# Patient Record
Sex: Female | Born: 1945 | Race: White | Hispanic: No | Smoking: Current every day smoker
Health system: Southern US, Community
[De-identification: ages and names within clinical notes are randomized; demographics above are authoritative.]

## PROBLEM LIST (undated history)

## (undated) DIAGNOSIS — F319 Bipolar disorder, unspecified: Secondary | ICD-10-CM

## (undated) DIAGNOSIS — E785 Hyperlipidemia, unspecified: Secondary | ICD-10-CM

## (undated) DIAGNOSIS — M1990 Unspecified osteoarthritis, unspecified site: Secondary | ICD-10-CM

## (undated) DIAGNOSIS — H409 Unspecified glaucoma: Secondary | ICD-10-CM

## (undated) DIAGNOSIS — I10 Essential (primary) hypertension: Secondary | ICD-10-CM

## (undated) DIAGNOSIS — N289 Disorder of kidney and ureter, unspecified: Secondary | ICD-10-CM

## (undated) DIAGNOSIS — Z720 Tobacco use: Secondary | ICD-10-CM

## (undated) DIAGNOSIS — M797 Fibromyalgia: Secondary | ICD-10-CM

## (undated) DIAGNOSIS — R918 Other nonspecific abnormal finding of lung field: Secondary | ICD-10-CM

## (undated) DIAGNOSIS — E119 Type 2 diabetes mellitus without complications: Secondary | ICD-10-CM

## (undated) DIAGNOSIS — F1010 Alcohol abuse, uncomplicated: Secondary | ICD-10-CM

## (undated) DIAGNOSIS — C801 Malignant (primary) neoplasm, unspecified: Secondary | ICD-10-CM

## (undated) DIAGNOSIS — J45909 Unspecified asthma, uncomplicated: Secondary | ICD-10-CM

## (undated) HISTORY — DX: Alcohol abuse, uncomplicated: F10.10

## (undated) HISTORY — DX: Fibromyalgia: M79.7

## (undated) HISTORY — PX: ABDOMINAL HYSTERECTOMY: SHX81

## (undated) HISTORY — DX: Hyperlipidemia, unspecified: E78.5

## (undated) HISTORY — PX: OTHER SURGICAL HISTORY: SHX169

## (undated) HISTORY — DX: Tobacco use: Z72.0

## (undated) HISTORY — PX: PAROTIDECTOMY: SUR1003

## (undated) HISTORY — DX: Unspecified glaucoma: H40.9

## (undated) HISTORY — DX: Unspecified asthma, uncomplicated: J45.909

## (undated) HISTORY — DX: Other nonspecific abnormal finding of lung field: R91.8

## (undated) HISTORY — PX: GALLBLADDER SURGERY: SHX652

## (undated) HISTORY — DX: Bipolar disorder, unspecified: F31.9

## (undated) HISTORY — DX: Unspecified osteoarthritis, unspecified site: M19.90

---

## 1998-08-23 ENCOUNTER — Other Ambulatory Visit: Admission: RE | Admit: 1998-08-23 | Discharge: 1998-08-23 | Payer: Self-pay | Admitting: Gastroenterology

## 2006-07-11 ENCOUNTER — Inpatient Hospital Stay (HOSPITAL_COMMUNITY): Admission: RE | Admit: 2006-07-11 | Discharge: 2006-07-16 | Payer: Self-pay | Admitting: Psychiatry

## 2006-07-12 ENCOUNTER — Ambulatory Visit: Payer: Self-pay | Admitting: *Deleted

## 2007-05-21 ENCOUNTER — Ambulatory Visit (HOSPITAL_COMMUNITY): Admission: RE | Admit: 2007-05-21 | Discharge: 2007-05-21 | Payer: Self-pay | Admitting: Internal Medicine

## 2007-10-20 ENCOUNTER — Ambulatory Visit (HOSPITAL_COMMUNITY): Admission: RE | Admit: 2007-10-20 | Discharge: 2007-10-20 | Payer: Self-pay | Admitting: Nephrology

## 2007-10-20 ENCOUNTER — Encounter (INDEPENDENT_AMBULATORY_CARE_PROVIDER_SITE_OTHER): Payer: Self-pay | Admitting: Nephrology

## 2007-11-13 ENCOUNTER — Ambulatory Visit: Payer: Self-pay | Admitting: Oncology

## 2007-12-25 LAB — CBC WITH DIFFERENTIAL/PLATELET
BASO%: 1.1 % (ref 0.0–2.0)
Eosinophils Absolute: 0.1 10*3/uL (ref 0.0–0.5)
HGB: 15.4 g/dL (ref 11.6–15.9)
MCHC: 34.2 g/dL (ref 32.0–36.0)
MCV: 89.6 fL (ref 81.0–101.0)
MONO%: 4.4 % (ref 0.0–13.0)
NEUT#: 10.7 10*3/uL — ABNORMAL HIGH (ref 1.5–6.5)
NEUT%: 76.8 % (ref 39.6–76.8)
Platelets: 345 10*3/uL (ref 145–400)
RBC: 5.03 10*6/uL (ref 3.70–5.32)
WBC: 14 10*3/uL — ABNORMAL HIGH (ref 3.9–10.0)
lymph#: 2.3 10*3/uL (ref 0.9–3.3)

## 2007-12-25 LAB — CHCC SMEAR

## 2009-01-04 IMAGING — PT NM PET TUM IMG SKULL BASE T - THIGH
6 series · 25 of 25 positions shown · non-contrast
Comparison: None

CLINICAL DATA: Lymphoma

FDG PET-CT TUMOR IMAGING (SKULL BASE TO THIGHS):
Fasting Blood Glucose:  107
TECHNIQUE: 14.2 mCi F-18 FDG was injected intravenously via the left
antecubital fossa .  Full-ring PET imaging was performed from the skull base
through the mid-thighs 60 minutes after injection.  CT data was obtained and
used for attenuation correction and anatomic localization only.  (This was not
acquired as a diagnostic CT examination.)

[Series 1: pet ac · axial · 3.3mm · 4.69mm/px · z∈[-870,+0]mm · 5 of 267 slices shown]
[im 1/267]
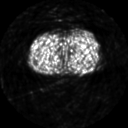
[im 67/267]
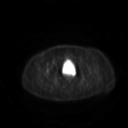
[im 134/267]
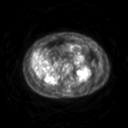
[im 200/267]
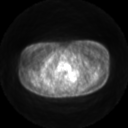
[im 267/267]
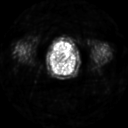

[Series 2: ct images · axial · 3.8mm · 0.98mm/px · z∈[-870,+0]mm · 5 of 266 slices shown]
[im 1/266  brain]
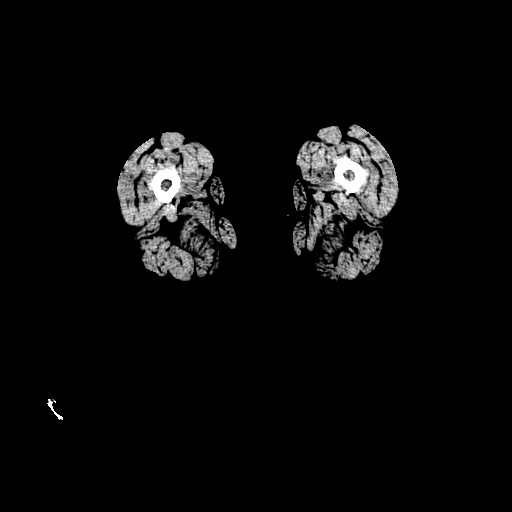
[im 67/266]
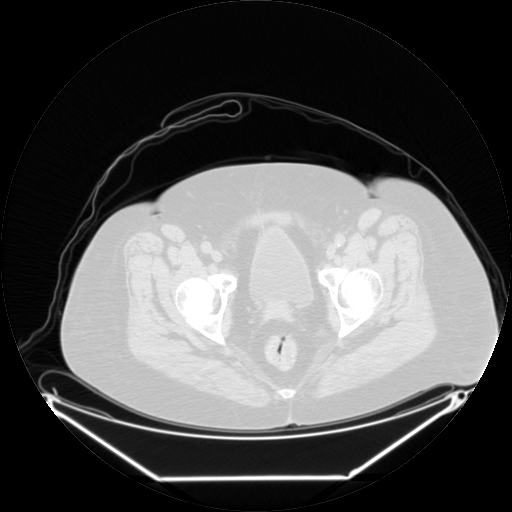
[im 133/266]
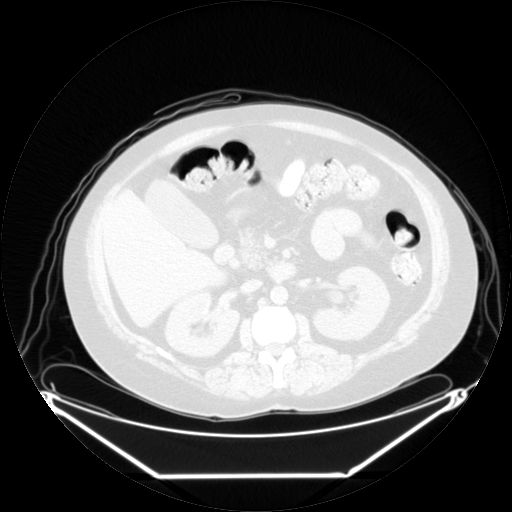
[im 199/266]
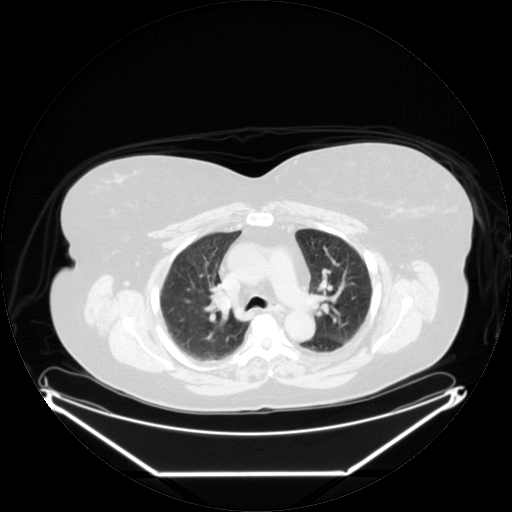
[im 266/266  brain]
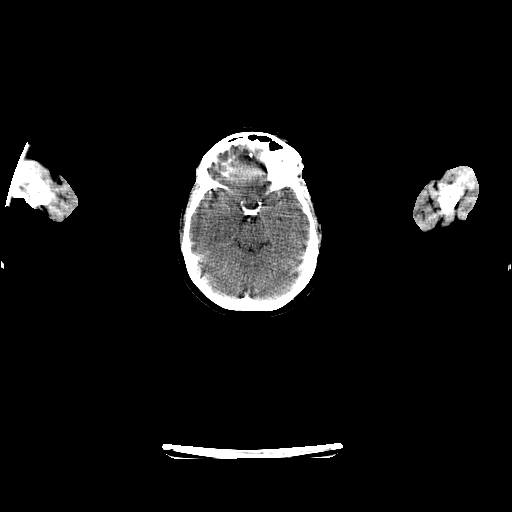

[Series 2: pet nac · axial · 3.3mm · 4.69mm/px · z∈[-870,+0]mm · 6 of 267 slices shown]
[im 1/267]
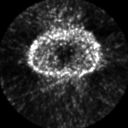
[im 54/267]
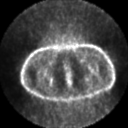
[im 107/267]
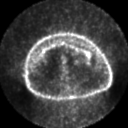
[im 160/267]
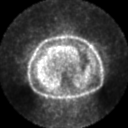
[im 213/267]
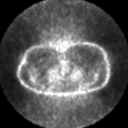
[im 267/267]
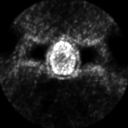

[Series 123: mip · coronal · 3.3mm · 4.69mm/px · 1 of 30 slices shown]
[im 1/30]
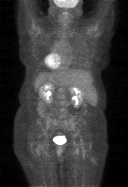

[Series 151: reformatted · axial · 3.3mm · 3.91mm/px · z∈[-870,+0]mm · 6 of 265 slices shown (1 of 2)]
[im 1/265]
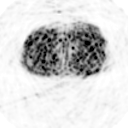
[im 53/265]
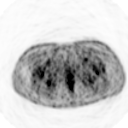
[im 106/265]
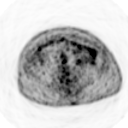
[im 159/265]
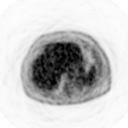
[im 212/265]
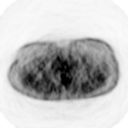
[im 265/265]
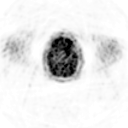

[Series 153: reformatted · coronal · 4.7mm · 6.98mm/px · 2 of 72 slices shown (2 of 2)]
[im 1/72]
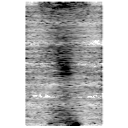
[im 72/72]
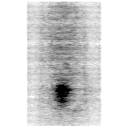

[25 of 25 positions shown; findings below may reference images not displayed]

FINDINGS: No hypermetabolic lymph nodes are seen within the soft tissues of the neck.

No hypermetabolic axillary lymph nodes.

No hypermetabolic mediastinal, or hilar lymph nodes.

No pericardial, or pleural effusions.

Soft tissue attenuating mass along the right paraspinous region this is within
the medial aspect of the right lower hemithorax. This soft tissue nodule/mass
measures 2. x 1.6 cm.  There is low level FDG uptake within this mass within SUV
max equal to 3.2. 

On the contralateral side there is a smaller paraspinal mass/nodule, image 103. 
This  measures 1.0 cm. No significant FDG uptake is identified.  No
hypermetabolic pulmonary nodule or masses noted.

There is normal uptake within the liver parenchyma.

The spleen is normal.

No hypermetabolic retroperitoneal, or small bowel mesentery lymph nodes.

Review the visualized axial and appendicular skeleton shows no abnormal foci of
increased uptake.

Mild fatty infiltration of the liver parenchyma.

There is a punctate stone within the lower pole collecting system of the right
kidney. No evidence for obstructive uropathy.

Negative for pelvic, or inguinal hypermetabolic adenopathy.
IMPRESSION: 1. The two paraspinal masses described on the CT from [facility] are
again seen on the current examination.  The larger mass on the right two
demonstrates low level, nonspecific FDG uptake proportional blood pool activity.
Findings likely represent benign etiology. Low-level malignancy is considered
less favored, but not entirely excluded. This may be managed of 2 ways: A
percutaneous biopsy may be performed to provide a definitive answer ,
alternatively a  more conservative approach would be followup examination in 3
to 6 months.  Findings were discussed with Dr. Montalbo.

## 2011-05-03 NOTE — Discharge Summary (Signed)
NAME:  Cathy Meadows, Cathy Meadows NO.:  [id number]   MEDICAL RECORD NO.:  [id number]          PATIENT TYPE:  IPS   LOCATION:  0305                          FACILITY:  [facility]   PHYSICIAN:  Jasmine Pang, M.D. DATE OF BIRTH:  15-Feb-1946   DATE OF ADMISSION:  07/11/2006  DATE OF DISCHARGE:  07/16/2006                                 DISCHARGE SUMMARY   IDENTIFYING INFORMATION:  The patient is a 65 year old divorced Caucasian  female who was admitted on a voluntary basis to my service on July 11, 2006.   HISTORY OF PRESENT ILLNESS:  The patient has had a history of depression.  She had a suicide attempt consisting of an overdose on Lexapro and drinking  wine.  She has been drinking one-and-a-half bottles daily for years.  Her  intention was to kill herself.  She left a suicide note; she does not  remember to whom.  The patient was treated in the ICU and placed on a  ventilator.  She developed aspiration pneumonia and is currently being  treated for this with Levaquin.  Stressors:  She states she feels lonely,  that her children have moved away.   PAST PSYCHIATRIC HISTORY:  This is the first [facility] admission for this patient.  She has had a history of depression.  She is treated by Dr. Lorin Picket.   FAMILY HISTORY:  She states depression runs in her family.   SUBSTANCE ABUSE HISTORY:  The patient smokes (two packs per day).  She also  has a history of alcohol dependence.   MEDICAL HISTORY:  Medical problems:  Hypertension, COPD, fibromyalgia,  status post aspiration pneumonia.   MEDICATIONS:  Lexapro, Norvasc, and Klonopin.   DRUG ALLERGIES:  PENICILLIN, CODEINE, ASPIRIN, SULFA, ROBAXIN.   PHYSICAL FINDINGS:  The patient's physical exam was performed while on the  inpatient unit after her overdose.  She has multiple bruises on both her  arms.   ADMISSION LABORATORIES:  UDS was positive for tricyclics.  Comprehensive  metabolic panel was grossly within normal limits except for  an elevated  glucose of 105 and an elevated SGOT of 353 and SGPT of 403.  CBC was grossly  within normal limits except for a slightly elevated white blood cell count  of 11.9.  The TSH was 1.379 (0.35 to 5.5).   HOSPITAL COURSE:  Upon admission, the patient was placed on Ambien 5 mg p.o.  q.h.s. p.r.n. insomnia.  She was also placed on Phenergan 12.5 mg IM or p.o.  every six hours p.r.n. times 24 hours.  She was placed on Flexeril 10 mg  p.o. q.h.s. and Klonopin 0.5 mg p.o. b.i.d.  On July 12, 2006, Klonopin was  discontinued.  She was placed on Librium 25 mg p.o. every six hours p.r.n.  On July 13, 2006, the patient was begun on prednisone taper 40 mg times two  days, then 30 mg p.o. times two days, then 20 mg p.o. times two days, then  10 mg p.o. times two days, then stop, and Levaquin 750 mg p.o. times five  days due to an aspiration pneumonia from  being intubated.  She was also  placed on DuoNeb every six hours p.r.n.  On July 13, 2006, due to a  decreased potassium, the patient was placed on K-Dur 40 mEq p.o. b.i.d.  times 48 hours (her potassium had been 2.8).  On July 14, 2006, the patient  was started in Risperdal 0.25 mg p.o. q.h.s. and Flexeril 5 mg p.o. t.i.d.  p.r.n.  Her Flexeril 10 mg p.o. q.h.s. was discontinued.  On June 25, 2006,  the patient was started on Monistat vaginal cream times three days due to a  yeast infection.  She was also started on Lexapro 10 mg now 10 mg daily, and  lithium carbonate 300 mg now, then 300 mg p.o. b.i.d.   The patient talked about working as a Lawyer for years.  She admitted she had  been depressed and attempted suicide by taking an overdose.  She said there  were a lot of stressors.  Her husband had died of cancer, and she had taken  care of him.  Her mother died, and then she lost her job.  Her daughter  moved to [employer].  She has an alcoholic son.  There were a lot of medical  problems.  She stated she saw Dr. Tomasa Rand who diagnosed  bipolar disorder.  She was placed on a combination of Depakote and Lamictal but states she  could not tolerate this.  She admitted drinking two bottles of wine per day.  She was tearful and sobbing throughout most of the interview.  On July 14, 2006, the patient was feeling much better.  She talked about her self-  medication with alcohol.  She stated she had fibromyalgia, and Flexeril has  helped her in the past.  This was started.  Her liver function tests  revealed an elevated AST and ALT, as indicated in the above lab section.  She was told at discharge she needed to see her primary care doctor for  further follow-up.  On July 15, 2006, the patient talked about her family.  She stated her children are very supportive.  She was doing well, but she  was agitated in group because one of the peers dominates it.  She felt  better than has in a while.  Suicidal ideation had resolved.  She was able  to look realistically at her situation and does not feel hopeless.  On the  night of her suicide attempt, she states she was feeling very lonely.  Her  children had moved away.  She felt the loneliness was one major stressor  that had resulted in her suicide attempt.   On July 16, 2006, the patient's mental status had improved markedly.  The  patient's eye contact was good.  Speech normal rate and flow.  Psychomotor  activity within normal limits.  The patient was friendly and cooperative.  She was less depressed and anxious.  Affect wider range.  No suicidal or  homicidal ideation.  No auditory or visual hallucinations.  No paranoia or  delusions.  Thoughts were logical and goal-directed.  Thought content:  No  predominant theme.  Cognitive exam was grossly within normal limits.   DISCHARGE DIAGNOSES:  AXIS I:  Mood disorder, not otherwise specified (rule  out bipolar disorder, depressed).  Alcohol dependence.  AXIS II:  None. AXIS III:  Hypertension, chronic obstructive pulmonary disease,   fibromyalgia.  AXIS IV:  Severe (other psychosocial problems, medical problems).  AXIS V:  Global Assessment of Functioning upon discharge was  43; Global  Assessment of Functioning upon admission was 20; Global Assessment of  Functioning highest past year was 65-70.   DISCHARGE PLANS:  There were no specific activity level or dietary  restrictions.   DISCHARGE MEDICATIONS:  1.  Prednisone 10 mg two pills daily on July 17, 2006, then one pill daily      on August 3 and July 19, 2006, then discontinue.  2.  Risperdal 0.25 mg p.o. q.h.s.  3.  Lexapro 20 mg p.o. daily.  4.  Lithium carbonate 300 mg p.o. b.i.d.  5.  Flexeril 5 mg p.o. t.i.d.   POST-HOSPITAL CARE PLANS:  It was recommended that the patient see her  primary care doctor about her elevated hepatic values.  The results of these  were written down on her pink sheet, so she could show them to her  doctor.  The patient will be seen for follow-up psychiatric treatment at  [facility].  She is to be there Thursday, July 17, 2006, at 9 a.m.  She will  continue to see Dr. Tomasa Rand and has a follow-up appointment on July 18, 2006 at 11:45 a.m.      Jasmine Pang, M.D.  Electronically Signed     [facility]/MEDQ  D:  07/16/2006  T:  07/16/2006  Job:  [id number]

## 2011-09-24 LAB — CBC
MCHC: 33.5
Platelets: 382
WBC: 16.8 — ABNORMAL HIGH

## 2013-07-08 ENCOUNTER — Emergency Department (HOSPITAL_COMMUNITY)
Admission: EM | Admit: 2013-07-08 | Discharge: 2013-07-08 | Disposition: A | Discharge status: Home / Self Care | Payer: Medicare Other | Attending: Emergency Medicine | Admitting: Emergency Medicine

## 2013-07-08 ENCOUNTER — Encounter (HOSPITAL_COMMUNITY): Payer: Self-pay | Admitting: *Deleted

## 2013-07-08 DIAGNOSIS — E1129 Type 2 diabetes mellitus with other diabetic kidney complication: Secondary | ICD-10-CM | POA: Insufficient documentation

## 2013-07-08 DIAGNOSIS — R141 Gas pain: Secondary | ICD-10-CM | POA: Insufficient documentation

## 2013-07-08 DIAGNOSIS — Z79899 Other long term (current) drug therapy: Secondary | ICD-10-CM | POA: Insufficient documentation

## 2013-07-08 DIAGNOSIS — R142 Eructation: Secondary | ICD-10-CM | POA: Insufficient documentation

## 2013-07-08 DIAGNOSIS — I129 Hypertensive chronic kidney disease with stage 1 through stage 4 chronic kidney disease, or unspecified chronic kidney disease: Secondary | ICD-10-CM | POA: Insufficient documentation

## 2013-07-08 DIAGNOSIS — R21 Rash and other nonspecific skin eruption: Secondary | ICD-10-CM | POA: Insufficient documentation

## 2013-07-08 DIAGNOSIS — N289 Disorder of kidney and ureter, unspecified: Secondary | ICD-10-CM | POA: Insufficient documentation

## 2013-07-08 DIAGNOSIS — F17200 Nicotine dependence, unspecified, uncomplicated: Secondary | ICD-10-CM | POA: Insufficient documentation

## 2013-07-08 DIAGNOSIS — R140 Abdominal distension (gaseous): Secondary | ICD-10-CM

## 2013-07-08 DIAGNOSIS — Z859 Personal history of malignant neoplasm, unspecified: Secondary | ICD-10-CM | POA: Insufficient documentation

## 2013-07-08 HISTORY — DX: Type 2 diabetes mellitus without complications: E11.9

## 2013-07-08 HISTORY — DX: Disorder of kidney and ureter, unspecified: N28.9

## 2013-07-08 HISTORY — DX: Essential (primary) hypertension: I10

## 2013-07-08 HISTORY — DX: Malignant (primary) neoplasm, unspecified: C80.1

## 2013-07-08 LAB — CBC WITH DIFFERENTIAL/PLATELET
Eosinophils Absolute: 0.2 10*3/uL (ref 0.0–0.7)
HCT: 44.9 % (ref 36.0–46.0)
Lymphs Abs: 3 10*3/uL (ref 0.7–4.0)
MCHC: 35.9 g/dL (ref 30.0–36.0)
Monocytes Relative: 6 % (ref 3–12)
Neutro Abs: 9.3 10*3/uL — ABNORMAL HIGH (ref 1.7–7.7)
Neutrophils Relative %: 70 % (ref 43–77)
Platelets: 280 10*3/uL (ref 150–400)
RBC: 4.99 MIL/uL (ref 3.87–5.11)

## 2013-07-08 LAB — URINALYSIS, ROUTINE W REFLEX MICROSCOPIC
Ketones, ur: NEGATIVE mg/dL
Leukocytes, UA: NEGATIVE
Nitrite: NEGATIVE
Urobilinogen, UA: 0.2 mg/dL (ref 0.0–1.0)

## 2013-07-08 LAB — PROTIME-INR: Prothrombin Time: 12.3 seconds (ref 11.6–15.2)

## 2013-07-08 LAB — COMPREHENSIVE METABOLIC PANEL
ALT: 33 U/L (ref 0–35)
AST: 31 U/L (ref 0–37)
CO2: 25 mEq/L (ref 19–32)
Creatinine, Ser: 0.55 mg/dL (ref 0.50–1.10)
Total Bilirubin: 0.3 mg/dL (ref 0.3–1.2)

## 2013-07-08 LAB — URINE MICROSCOPIC-ADD ON

## 2013-07-08 NOTE — ED Notes (Signed)
Pt states she has been going to her MD in [place] for her connective tissue disease, renal failure, and recently diagnosed with rheumatoid arthritis. Pt states her physician needed some help with managing her sx so she was told to come here. Pt state she also has raynaud's disease.

## 2013-07-08 NOTE — ED Notes (Signed)
Fayrene Helper PA at bedside.

## 2013-07-08 NOTE — ED Notes (Signed)
MD at bedside. 

## 2013-07-08 NOTE — ED Provider Notes (Signed)
Medical screening examination/treatment/procedure(s) were conducted as a shared visit with non-physician practitioner(s) and myself.  I personally evaluated the patient during the encounter  Multiple complaints. MSE performed, no emergency. Pt will need close pcp and rheum followup. Large outpatient workup to be performed. Bilateral LE pulses. No unilateral leg swelling  Lyanne Co, MD 07/08/13 [phone]

## 2013-07-08 NOTE — ED Provider Notes (Signed)
CSN: [id number]     Arrival date & time 07/08/13  1502 History     First MD Initiated Contact with Patient 07/08/13 1748     Chief Complaint  Patient presents with  . multiple complaints    (Consider location/radiation/quality/duration/timing/severity/associated sxs/prior Treatment) HPI  67 year old female with history of non-insulin-dependent diabetes, hypertension, kidney disease presents with multiple symptoms.  Pt report she has a hx of connective tissue disorder. Patient states for the past 3 weeks she has had many changes to the body. Sts she has Raynard's which causes her hand to change in skin color. She noticed that her abdomen has been increasing in size especially in the past 3 weeks. She also noticed that her skin especially to both arms or changing, and turning color. 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  Rash is mildly itchy.  Report dry mouth which she sts it's due to her Sjogren.  Report having brown skin in both of her legs.  She thinks her blood is thin.  She also report having hx of glomerular sclerosis.   Pt sts her PCP in [place] is currently monitor her condition.  Sts she has biopsy and blood work recently which indicated that she's RA positive.  STs she has not receive any specific treatment.  She mentioned she is scheduled to f/u with rheumatologist in August and also GI specialist in the near future as well.  She is here due to concern if the progressiveness of her sxs.  She denies fever, cp, sob, n/v/d, abd pain, dysuria, numbness or weakness.   Past Medical History  Diagnosis Date  . Diabetes mellitus without complication   . Hypertension   . Renal disorder   . Cancer    History reviewed. No pertinent past surgical history. No family history on file. History  Substance Use Topics  . Smoking status: Current Every Day Smoker  . Smokeless tobacco: Not on file  . Alcohol Use: No   OB History   Grav Para Term Preterm  Abortions TAB SAB Ect Mult Living                 Review of Systems  All other systems reviewed and are negative.    Allergies  Codeine; Penicillins; Robaxin; Sulfa antibiotics; and Tizanidine  Home Medications   Current Outpatient Rx  Name  Route  Sig  Dispense  Refill  . amLODipine (NORVASC) 10 MG tablet   Oral   Take 10 mg by mouth daily.         . cholecalciferol (VITAMIN D) 1000 UNITS tablet   Oral   Take 1,000 Units by mouth daily.         Marland Kitchen escitalopram (LEXAPRO) 10 MG tablet   Oral   Take 10 mg by mouth 2 (two) times daily.         . fish oil-omega-3 fatty acids 1000 MG capsule   Oral   Take 1 g by mouth 2 (two) times daily.         . furosemide (LASIX) 20 MG tablet   Oral   Take 20 mg by mouth daily as needed for fluid or edema.         Marland Kitchen lamiVUDine (EPIVIR) 150 MG tablet   Oral   Take 150 mg by mouth 2 (two) times daily.         Marland Kitchen LORazepam (ATIVAN) 1 MG tablet   Oral   Take 1 mg by  mouth 2 (two) times daily as needed for anxiety.         . pravastatin (PRAVACHOL) 80 MG tablet   Oral   Take 80 mg by mouth at bedtime.         . valsartan (DIOVAN) 320 MG tablet   Oral   Take 320 mg by mouth at bedtime.          BP 153/96  Pulse 106  Temp(Src) 98.8 F (37.1 C) (Oral)  Resp 25  Ht 5\' 3"  (1.6 m)  Wt 190 lb (86.183 kg)  BMI 33.67 kg/m2  SpO2 96% Physical Exam  Nursing note and vitals reviewed. Constitutional: She is oriented to person, place, and time. She appears well-developed and well-nourished. No distress.  HENT:  Head: Normocephalic and atraumatic.  Mouth/Throat: Oropharynx is clear and moist.  Eyes: Conjunctivae and EOM are normal. Pupils are equal, round, and reactive to light.  Neck: Neck supple. No JVD present.  Cardiovascular: Normal rate, regular rhythm and intact distal pulses.   Pulmonary/Chest: Effort normal and breath sounds normal.  Mild scattered rhonchi  Abdominal: She exhibits distension. There is no  tenderness. There is no rebound and no guarding.  Protuberance abdomen, nontender on palpation.  No hernia  Musculoskeletal: She exhibits no edema and no tenderness.  Neurological: She is alert and oriented to person, place, and time.  Skin: Skin is warm. Rash (skin with several small ecchymosis to forearms, no significant changes.  No petechiae/vesicular/pustular lesions) noted.    ED Course   Procedures (including critical care time)  6:46 PM Pt concerns of worsening changes related to her connective tissue disorders.  However, no acute emergent condition here in ER.  Has blood in urine, denies any urinary discomfort.  Has no CVA tenderness to suggest kidney stone.  Has elevated WBC of 13.4, but similar to baseline.  No evidence of low platelets, normal PT/INR.  C/o abd distension, but no vomit, diarrhea, fever.  Having normal BM, non surgical abdomen.  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  Care discussed with attending.    7:40 PM My attending has seen, evaluate and reassure pt.  Pt has close f/u for further outpt workup.  Return precaution discussed.  Otherwise pt stable for discharge.   Labs Reviewed  URINALYSIS, ROUTINE W REFLEX MICROSCOPIC - Abnormal; Notable for the following:    Hgb urine dipstick MODERATE (*)    Protein, ur 100 (*)    All other components within normal limits  CBC WITH DIFFERENTIAL - Abnormal; Notable for the following:    WBC 13.4 (*)    Hemoglobin 16.1 (*)    Neutro Abs 9.3 (*)    All other components within normal limits  COMPREHENSIVE METABOLIC PANEL - Abnormal; Notable for the following:    Glucose, Bld 140 (*)    All other components within normal limits  PROTIME-INR  URINE MICROSCOPIC-ADD ON   No results found. 1. Abdominal distension     MDM  BP 136/77  Pulse 95  Temp(Src) 98.8 F (37.1 C) (Oral)  Resp 16  Ht 5\' 3"  (1.6 m)  Wt 190 lb (86.183 kg)  BMI 33.67 kg/m2  SpO2 94%   Fayrene Helper,  PA-C 07/08/13 1941

## 2013-07-08 NOTE — ED Notes (Signed)
For 3 months she has had  Renal failure .  She has a swollen abd sob  For 3 weeks she has feet that are turning brown and she has lesions  Scattered over her body that are now bleeding.  dieti controlled diabetes.  She has been seeing a doctor in [place].  She reports that she has shogrens  .  She is not on dialysis.  Also her feet have been blue intermittently

## 2014-11-22 ENCOUNTER — Institutional Professional Consult (permissible substitution) (INDEPENDENT_AMBULATORY_CARE_PROVIDER_SITE_OTHER): Payer: Medicare Other | Admitting: Thoracic Surgery (Cardiothoracic Vascular Surgery)

## 2014-11-22 ENCOUNTER — Encounter: Payer: Self-pay | Admitting: Thoracic Surgery (Cardiothoracic Vascular Surgery)

## 2014-11-22 ENCOUNTER — Other Ambulatory Visit: Payer: Self-pay | Admitting: *Deleted

## 2014-11-22 VITALS — BP 144/74 | HR 118 | Resp 20 | Ht 63.0 in | Wt 164.0 lb

## 2014-11-22 DIAGNOSIS — E119 Type 2 diabetes mellitus without complications: Secondary | ICD-10-CM

## 2014-11-22 DIAGNOSIS — C801 Malignant (primary) neoplasm, unspecified: Secondary | ICD-10-CM

## 2014-11-22 DIAGNOSIS — N289 Disorder of kidney and ureter, unspecified: Secondary | ICD-10-CM

## 2014-11-22 DIAGNOSIS — J45909 Unspecified asthma, uncomplicated: Secondary | ICD-10-CM | POA: Insufficient documentation

## 2014-11-22 DIAGNOSIS — F319 Bipolar disorder, unspecified: Secondary | ICD-10-CM | POA: Insufficient documentation

## 2014-11-22 DIAGNOSIS — H409 Unspecified glaucoma: Secondary | ICD-10-CM

## 2014-11-22 DIAGNOSIS — M1990 Unspecified osteoarthritis, unspecified site: Secondary | ICD-10-CM | POA: Insufficient documentation

## 2014-11-22 DIAGNOSIS — R918 Other nonspecific abnormal finding of lung field: Secondary | ICD-10-CM | POA: Insufficient documentation

## 2014-11-22 DIAGNOSIS — F3160 Bipolar disorder, current episode mixed, unspecified: Secondary | ICD-10-CM

## 2014-11-22 DIAGNOSIS — J984 Other disorders of lung: Secondary | ICD-10-CM

## 2014-11-22 DIAGNOSIS — I272 Pulmonary hypertension, unspecified: Secondary | ICD-10-CM

## 2014-11-22 DIAGNOSIS — I10 Essential (primary) hypertension: Secondary | ICD-10-CM

## 2014-11-22 DIAGNOSIS — C3431 Malignant neoplasm of lower lobe, right bronchus or lung: Secondary | ICD-10-CM | POA: Insufficient documentation

## 2014-11-22 DIAGNOSIS — M797 Fibromyalgia: Secondary | ICD-10-CM

## 2014-11-22 DIAGNOSIS — J4540 Moderate persistent asthma, uncomplicated: Secondary | ICD-10-CM

## 2014-11-22 DIAGNOSIS — E785 Hyperlipidemia, unspecified: Secondary | ICD-10-CM | POA: Insufficient documentation

## 2014-11-22 NOTE — Progress Notes (Signed)
PCP is PROCHNAU,CAROLINE, MD Referring Provider is Ernestene Kiel, MD  Chief Complaint  Patient presents with  . Lung Mass    Surgical eval, Chest CT 10/28/14, PET Scan 11/14/14    HPI: 68 year old woman sent for consultation regarding a right lung mass.  Cathy Meadows is a 68 year old woman with a history of bipolar disorder, hypertension, and diabetes. Cathy Meadows has an 80-pack-year history of smoking. Cathy Meadows suffers from mild both anxiety and depression, however anxiety is the primary complaint. Cathy Meadows has a chronic cough, which is nonproductive. Cathy Meadows says that about 4 weeks ago this cough got worse. In addition to that Cathy Meadows's had multiple falls over the past several weeks. As part of her workup Cathy Meadows had a chest x-ray which showed a possible elevation of the right hemidiaphragm. That led to a CT of the chest which showed that there was in fact a 10 x 8.9 x 12.2 cm right lower lobe mass. There also were bilateral paraspinal masses, however, these were unchanged compared to a CT from 2012. Cathy Meadows was also noted to have enlarged pulmonary arteries suggestive of pulmonary hypertension. Cathy Meadows did have coronary artery atherosclerotic changes.  A PET CT was done which showed the lung mass to be hypermetabolic. There is no evidence of regional or distant metastases. Cathy Meadows did have an incidentally noted indeterminate parotid lesion.  Cathy Meadows is a poor historian and much of the story comes from her son. Cathy Meadows does complain of a 20 pound weight loss over the past 6 months, 10 pounds over the past 3 months. Cathy Meadows has had multiple falls recently. Her son is concerned that it could be due to polypharmacy. Cathy Meadows denies any headaches or visual changes. Cathy Meadows denies any chest pain, pressure, or tightness with exertion or at rest.  Zubrod Score: At the time of surgery this patient's most appropriate activity status/level should be described as: []     0    Normal activity, no symptoms []     1    Restricted in physical strenuous activity but  ambulatory, able to do out light work [x]     2    Ambulatory and capable of self care, unable to do work activities, up and about >50 % of waking hours                              []     3    Only limited self care, in bed greater than 50% of waking hours []     4    Completely disabled, no self care, confined to bed or chair []     5    Moribund    Past Medical History  Diagnosis Date  . Diabetes mellitus without complication   . Hypertension   . Renal disorder   . Cancer   . Bipolar disorder   . Osteoarthritis   . Fibromyalgia   . Glaucoma     right eye  . Hyperlipidemia   . Asthma   . Alcohol abuse     quit 2007 per patient  . Tobacco abuse   . Right lower lobe lung mass     1st noted 09/2014    Past Surgical History  Procedure Laterality Date  . Breast surgery right    . Parotidectomy    . Gallbladder surgery    . Abdominal hysterectomy      complete    Family History  Problem Relation Age of Onset  . Hypertension Father   .  Cancer Father     skin  . Stroke Father   . Heart disease Father   . Hypertension Mother   . Heart disease Mother   . Breast cancer Mother   . Cancer Mother     skin  . Stroke Mother     Social History History  Substance Use Topics  . Smoking status: Current Every Day Smoker -- 2.00 packs/day for 40 years    Types: Cigarettes  . Smokeless tobacco: Not on file  . Alcohol Use: No     Comment: Former heavy drinker, quit 2007    Current Outpatient Prescriptions  Medication Sig Dispense Refill  . albuterol (PROVENTIL) (2.5 MG/3ML) 0.083% nebulizer solution Take 2.5 mg by nebulization every 6 (six) hours as needed for wheezing or shortness of breath.    Marland Kitchen ammonium lactate (LAC-HYDRIN) 12 % lotion Apply 1 application topically 2 (two) times daily. To the affected areas    . brimonidine (ALPHAGAN P) 0.1 % SOLN Place 1 drop into the right eye 2 (two) times daily.    . cyclobenzaprine (FLEXERIL) 10 MG tablet Take 10 mg by mouth 3 (three)  times daily as needed for muscle spasms.    Marland Kitchen lamoTRIgine (LAMICTAL) 150 MG tablet Take 150 mg by mouth 2 (two) times daily.    Marland Kitchen latanoprost (XALATAN) 0.005 % ophthalmic solution Place 1 drop into the right eye at bedtime.    . metFORMIN (GLUCOPHAGE-XR) 500 MG 24 hr tablet Take 500 mg by mouth daily with breakfast.    . pioglitazone (ACTOS) 15 MG tablet Take 15 mg by mouth daily.    . vitamin B-12 (CYANOCOBALAMIN) 1000 MCG tablet Take 1,000 mcg by mouth daily.    Marland Kitchen amLODipine (NORVASC) 10 MG tablet Take 10 mg by mouth daily.    . cholecalciferol (VITAMIN D) 1000 UNITS tablet Take 1,000 Units by mouth daily.    Marland Kitchen escitalopram (LEXAPRO) 10 MG tablet Take 10 mg by mouth 2 (two) times daily.    . fish oil-omega-3 fatty acids 1000 MG capsule Take 1 g by mouth 2 (two) times daily.    . furosemide (LASIX) 20 MG tablet Take 20 mg by mouth daily as needed for fluid or edema.    Marland Kitchen LORazepam (ATIVAN) 1 MG tablet Take 1 mg by mouth 2 (two) times daily as needed for anxiety.    . pravastatin (PRAVACHOL) 80 MG tablet Take 80 mg by mouth at bedtime.    . valsartan (DIOVAN) 320 MG tablet Take 320 mg by mouth at bedtime.     No current facility-administered medications for this visit.    Allergies  Allergen Reactions  . Codeine Rash  . Penicillins Rash  . Robaxin [Methocarbamol] Rash  . Sulfa Antibiotics Rash  . Tizanidine Rash    Review of Systems  Constitutional: Positive for activity change, appetite change and unexpected weight change (20 pound weight loss over 6 months, 10 pounds over 3 months).  Respiratory: Positive for cough. Negative for shortness of breath.   Cardiovascular: Negative for chest pain and leg swelling.  Neurological: Positive for tremors, facial asymmetry (Mild), speech difficulty (slurred speech), weakness (generalized) and light-headedness (Multiple falls). Negative for headaches.  Psychiatric/Behavioral: Positive for dysphoric mood. The patient is nervous/anxious.   All  other systems reviewed and are negative.   BP 144/74 mmHg  Pulse 118  Resp 20  Ht 5\' 3"  (1.6 m)  Wt 164 lb (74.39 kg)  BMI 29.06 kg/m2  SpO2 95% Physical Exam  Constitutional:  Cathy Meadows is oriented to person, place, and time. Cathy Meadows appears well-developed and well-nourished. No distress.  Very anxious  HENT:  Head: Normocephalic and atraumatic.  Eyes: Conjunctivae are normal. Pupils are equal, round, and reactive to light.  Neck: Neck supple. No thyromegaly present.  Cardiovascular: Normal rate, regular rhythm and normal heart sounds.   No murmur heard. Pulmonary/Chest: Effort normal. Cathy Meadows has no wheezes.  Diminished breath sounds right base posteriorly  Abdominal: Soft. There is no tenderness.  Musculoskeletal: Cathy Meadows exhibits no edema.  Lymphadenopathy:    Cathy Meadows has no cervical adenopathy.  Neurological: Cathy Meadows is alert and oriented to person, place, and time. No cranial nerve deficit.  Tremor left hand, slurred speech, hesitant speech  Skin: Skin is warm and dry.  Vitals reviewed.    Diagnostic Tests:  CT CHEST 10/28/2014 IMPRESSION:  1. Large (10 cm x 8.9 x 12.2 cm) right pulmonary soft tissue mass. This accounts for the changes seen on the recent chest radiograph and is highly suspicious for malignancy. Primary pulmonary lymphoma or primary bronchogenic carcinoma are felt to be the primary considerations. Initial evaluation with PET-CT is recommended for staging and to assist with planning the best approach for biopsy. Ultimately, this mass will require tissue diagnosis. 2. Bilateral paraspinal masses at the T10/T11 level are without significant change compared to a CT of 2012. These masses could reflect paraspinal lymphadenopathy or neurogenic tumors. 3. Probable postobstructive pneumonitis the anterior right lung base. 4. Cardiomegaly with coronary artery atherosclerotic calcification. 5. Prominent periportal lymph nodes. These results will be called to the ordering clinician  or representative by the Radiologist Assistant, and communication documented in the PACS or zVision Dashboard.   Electronically Signed By: Curlene Dolphin M.D. On: 10/28/2014 17:29    PET/CT 11/15/2014 IMPRESSION: 1. Hypermetabolic right lung base mass, most consistent with primary bronchogenic carcinoma. 2. No evidence of thoracic nodal or extra thoracic metastasis. 3. Extensive muscular activity, slightly decreasing sensitivity for subtle metastasis. 4. Incidental findings, including nephrolithiasis, hepatic steatosis, and coronary artery atherosclerosis. 5. Pulmonary artery enlargement suggests pulmonary arterial hypertension. 6. Hypermetabolic nodule in the left parotid gland. Indeterminate. Unlikely to represent metastasis. Considerations include a reactive lymph node or an incidental parotid neoplasm. Given size stability since 10/13/2013, follow-up on subsequent PET is a potential clinical strategy.   Electronically Signed By: Abigail Miyamoto M.D.    Impression: 68 year old woman with a history of tobacco abuse who has a large right lower lobe mass. This is hypermetabolic by PET CT. It most likely represents a primary bronchogenic carcinoma. There is no evidence of regional or distant metastases. Cathy Meadows does have some paraspinous masses which have been present since at least 2008 and are unchanged since at least 2012. The lung mass most likely is a squamous cell given its large size without metastases. However, there does exist possibility this could be some other type of tumor such as a sarcoma. I think it would be reasonable to get a CT-guided biopsy of the mass prior to surgery to know what we are dealing with.  I have some concerns about her ability to tolerate a bilobectomy. Cathy Meadows does have some physical limitations and has been having frequent falls. Cathy Meadows needs an MR of the brain to rule out brain metastases.  Another concern is a possibility pulmonary hypertension. Her  pulmonary arteries are large on the CT and PET/CT. I'm going to order an echocardiogram to further assess that Cathy Meadows would get an estimate of her pulmonary artery pressures.  Cathy Meadows also needs pulmonary function  testing with and without bronchodilators.  I'll plan to see her back after we've done those studies so that we can decide whether or not Cathy Meadows might be a potential operative candidate.

## 2014-11-29 ENCOUNTER — Inpatient Hospital Stay (HOSPITAL_COMMUNITY)
Admission: AD | Admit: 2014-11-29 | Discharge: 2014-12-06 | DRG: 189 | Disposition: A | Discharge status: Home / Self Care | Payer: Medicare Other | Source: Other Acute Inpatient Hospital | Attending: Neurosurgery | Admitting: Neurosurgery

## 2014-11-29 ENCOUNTER — Inpatient Hospital Stay (HOSPITAL_COMMUNITY): Payer: Medicare Other

## 2014-11-29 ENCOUNTER — Encounter (HOSPITAL_COMMUNITY): Payer: Self-pay

## 2014-11-29 DIAGNOSIS — E43 Unspecified severe protein-calorie malnutrition: Secondary | ICD-10-CM | POA: Diagnosis present

## 2014-11-29 DIAGNOSIS — Z515 Encounter for palliative care: Secondary | ICD-10-CM | POA: Diagnosis not present

## 2014-11-29 DIAGNOSIS — D72829 Elevated white blood cell count, unspecified: Secondary | ICD-10-CM | POA: Diagnosis present

## 2014-11-29 DIAGNOSIS — R5383 Other fatigue: Secondary | ICD-10-CM

## 2014-11-29 DIAGNOSIS — I10 Essential (primary) hypertension: Secondary | ICD-10-CM | POA: Diagnosis present

## 2014-11-29 DIAGNOSIS — F419 Anxiety disorder, unspecified: Secondary | ICD-10-CM | POA: Diagnosis present

## 2014-11-29 DIAGNOSIS — J449 Chronic obstructive pulmonary disease, unspecified: Secondary | ICD-10-CM | POA: Diagnosis not present

## 2014-11-29 DIAGNOSIS — F17210 Nicotine dependence, cigarettes, uncomplicated: Secondary | ICD-10-CM | POA: Diagnosis not present

## 2014-11-29 DIAGNOSIS — J9601 Acute respiratory failure with hypoxia: Secondary | ICD-10-CM | POA: Diagnosis present

## 2014-11-29 DIAGNOSIS — Z9181 History of falling: Secondary | ICD-10-CM

## 2014-11-29 DIAGNOSIS — F319 Bipolar disorder, unspecified: Secondary | ICD-10-CM | POA: Diagnosis present

## 2014-11-29 DIAGNOSIS — Z880 Allergy status to penicillin: Secondary | ICD-10-CM

## 2014-11-29 DIAGNOSIS — Z9981 Dependence on supplemental oxygen: Secondary | ICD-10-CM

## 2014-11-29 DIAGNOSIS — Z66 Do not resuscitate: Secondary | ICD-10-CM | POA: Diagnosis not present

## 2014-11-29 DIAGNOSIS — Z886 Allergy status to analgesic agent status: Secondary | ICD-10-CM

## 2014-11-29 DIAGNOSIS — R112 Nausea with vomiting, unspecified: Secondary | ICD-10-CM | POA: Diagnosis not present

## 2014-11-29 DIAGNOSIS — J45909 Unspecified asthma, uncomplicated: Secondary | ICD-10-CM | POA: Diagnosis not present

## 2014-11-29 DIAGNOSIS — Z79899 Other long term (current) drug therapy: Secondary | ICD-10-CM

## 2014-11-29 DIAGNOSIS — R0600 Dyspnea, unspecified: Secondary | ICD-10-CM | POA: Diagnosis present

## 2014-11-29 DIAGNOSIS — C3490 Malignant neoplasm of unspecified part of unspecified bronchus or lung: Secondary | ICD-10-CM | POA: Diagnosis present

## 2014-11-29 DIAGNOSIS — H409 Unspecified glaucoma: Secondary | ICD-10-CM | POA: Diagnosis not present

## 2014-11-29 DIAGNOSIS — R1110 Vomiting, unspecified: Secondary | ICD-10-CM

## 2014-11-29 DIAGNOSIS — R0902 Hypoxemia: Secondary | ICD-10-CM | POA: Diagnosis present

## 2014-11-29 DIAGNOSIS — Z882 Allergy status to sulfonamides status: Secondary | ICD-10-CM

## 2014-11-29 DIAGNOSIS — R52 Pain, unspecified: Secondary | ICD-10-CM

## 2014-11-29 DIAGNOSIS — M1990 Unspecified osteoarthritis, unspecified site: Secondary | ICD-10-CM | POA: Diagnosis not present

## 2014-11-29 DIAGNOSIS — E785 Hyperlipidemia, unspecified: Secondary | ICD-10-CM | POA: Diagnosis present

## 2014-11-29 DIAGNOSIS — R140 Abdominal distension (gaseous): Secondary | ICD-10-CM

## 2014-11-29 DIAGNOSIS — R918 Other nonspecific abnormal finding of lung field: Secondary | ICD-10-CM | POA: Insufficient documentation

## 2014-11-29 DIAGNOSIS — Z9889 Other specified postprocedural states: Secondary | ICD-10-CM

## 2014-11-29 DIAGNOSIS — E119 Type 2 diabetes mellitus without complications: Secondary | ICD-10-CM

## 2014-11-29 DIAGNOSIS — C7931 Secondary malignant neoplasm of brain: Secondary | ICD-10-CM | POA: Diagnosis not present

## 2014-11-29 LAB — PHOSPHORUS: PHOSPHORUS: 2.6 mg/dL (ref 2.3–4.6)

## 2014-11-29 LAB — COMPREHENSIVE METABOLIC PANEL
ALT: 19 U/L (ref 0–35)
ANION GAP: 14 (ref 5–15)
AST: 21 U/L (ref 0–37)
Albumin: 3 g/dL — ABNORMAL LOW (ref 3.5–5.2)
Alkaline Phosphatase: 120 U/L — ABNORMAL HIGH (ref 39–117)
BUN: 20 mg/dL (ref 6–23)
CALCIUM: 11.4 mg/dL — AB (ref 8.4–10.5)
CHLORIDE: 98 meq/L (ref 96–112)
CO2: 25 meq/L (ref 19–32)
Creatinine, Ser: 0.65 mg/dL (ref 0.50–1.10)
GFR, EST NON AFRICAN AMERICAN: 89 mL/min — AB (ref >90)
Glucose, Bld: 126 mg/dL — ABNORMAL HIGH (ref 70–99)
Potassium: 4.1 mEq/L (ref 3.7–5.3)
Sodium: 137 mEq/L (ref 137–147)
Total Protein: 7.1 g/dL (ref 6.0–8.3)

## 2014-11-29 LAB — CBC
HCT: 38.8 % (ref 36.0–46.0)
Hemoglobin: 12 g/dL (ref 12.0–15.0)
MCH: 26.3 pg (ref 26.0–34.0)
MCHC: 30.9 g/dL (ref 30.0–36.0)
MCV: 84.9 fL (ref 78.0–100.0)
PLATELETS: 467 10*3/uL — AB (ref 150–400)
RBC: 4.57 MIL/uL (ref 3.87–5.11)
RDW: 17.7 % — AB (ref 11.5–15.5)
WBC: 15 10*3/uL — ABNORMAL HIGH (ref 4.0–10.5)

## 2014-11-29 LAB — MAGNESIUM: MAGNESIUM: 1.8 mg/dL (ref 1.5–2.5)

## 2014-11-29 LAB — HEMOGLOBIN A1C
HEMOGLOBIN A1C: 6.4 % — AB (ref <5.7)
Mean Plasma Glucose: 137 mg/dL — ABNORMAL HIGH (ref <117)

## 2014-11-29 LAB — TSH: TSH: 0.282 u[IU]/mL — AB (ref 0.350–4.500)

## 2014-11-29 LAB — PRO B NATRIURETIC PEPTIDE: Pro B Natriuretic peptide (BNP): 687.2 pg/mL — ABNORMAL HIGH (ref 0–125)

## 2014-11-29 MED ORDER — CYCLOBENZAPRINE HCL 10 MG PO TABS
10.0000 mg | ORAL_TABLET | Freq: Three times a day (TID) | ORAL | Status: DC | PRN
  Filled 2014-11-29: qty 1

## 2014-11-29 MED ORDER — ALBUTEROL SULFATE (2.5 MG/3ML) 0.083% IN NEBU
2.5000 mg | INHALATION_SOLUTION | Freq: Four times a day (QID) | RESPIRATORY_TRACT | Status: DC
  Administered 2014-11-29 – 2014-11-30 (×3): 2.5 mg via RESPIRATORY_TRACT
  Filled 2014-11-29 (×4): qty 3

## 2014-11-29 MED ORDER — ONDANSETRON HCL 4 MG/2ML IJ SOLN
4.0000 mg | Freq: Four times a day (QID) | INTRAMUSCULAR | Status: DC | PRN
  Administered 2014-11-30 – 2014-12-06 (×7): 4 mg via INTRAVENOUS
  Filled 2014-11-29 (×7): qty 2

## 2014-11-29 MED ORDER — ENOXAPARIN SODIUM 40 MG/0.4ML ~~LOC~~ SOLN
40.0000 mg | SUBCUTANEOUS | Status: DC
  Administered 2014-11-29: 40 mg via SUBCUTANEOUS
  Filled 2014-11-29: qty 0.4

## 2014-11-29 MED ORDER — SODIUM CHLORIDE 0.9 % IV SOLN: 250.0000 mL | INTRAVENOUS | Status: DC | PRN

## 2014-11-29 MED ORDER — OXYCODONE HCL 5 MG PO TABS
5.0000 mg | ORAL_TABLET | ORAL | Status: DC | PRN
  Administered 2014-11-29 – 2014-12-04 (×3): 5 mg via ORAL
  Filled 2014-11-29 (×3): qty 1

## 2014-11-29 MED ORDER — IRBESARTAN 75 MG PO TABS
75.0000 mg | ORAL_TABLET | Freq: Every day | ORAL | Status: DC
  Administered 2014-11-29 – 2014-12-03 (×5): 75 mg via ORAL
  Filled 2014-11-29 (×8): qty 1

## 2014-11-29 MED ORDER — LATANOPROST 0.005 % OP SOLN
1.0000 [drp] | Freq: Every day | OPHTHALMIC | Status: DC
  Administered 2014-11-29 – 2014-12-05 (×7): 1 [drp] via OPHTHALMIC
  Filled 2014-11-29: qty 2.5

## 2014-11-29 MED ORDER — ONDANSETRON HCL 4 MG PO TABS: 4.0000 mg | ORAL_TABLET | Freq: Four times a day (QID) | ORAL | Status: DC | PRN

## 2014-11-29 MED ORDER — AMLODIPINE BESYLATE 10 MG PO TABS
10.0000 mg | ORAL_TABLET | Freq: Every day | ORAL | Status: DC
  Administered 2014-11-30 – 2014-12-04 (×5): 10 mg via ORAL
  Filled 2014-11-29 (×7): qty 1

## 2014-11-29 MED ORDER — LORAZEPAM 1 MG PO TABS
1.0000 mg | ORAL_TABLET | Freq: Two times a day (BID) | ORAL | Status: DC | PRN
  Administered 2014-12-01 – 2014-12-04 (×7): 1 mg via ORAL
  Filled 2014-11-29 (×7): qty 1

## 2014-11-29 MED ORDER — SODIUM CHLORIDE 0.9 % IJ SOLN: 3.0000 mL | INTRAMUSCULAR | Status: DC | PRN

## 2014-11-29 MED ORDER — SODIUM CHLORIDE 0.9 % IJ SOLN
3.0000 mL | Freq: Two times a day (BID) | INTRAMUSCULAR | Status: DC
  Administered 2014-11-29 – 2014-12-05 (×10): 3 mL via INTRAVENOUS

## 2014-11-29 MED ORDER — ESCITALOPRAM OXALATE 10 MG PO TABS
10.0000 mg | ORAL_TABLET | Freq: Two times a day (BID) | ORAL | Status: DC
  Administered 2014-11-29 – 2014-12-04 (×10): 10 mg via ORAL
  Filled 2014-11-29 (×14): qty 1

## 2014-11-29 MED ORDER — ALBUTEROL SULFATE (2.5 MG/3ML) 0.083% IN NEBU: 2.5000 mg | INHALATION_SOLUTION | RESPIRATORY_TRACT | Status: DC | PRN

## 2014-11-29 MED ORDER — LAMOTRIGINE 150 MG PO TABS
150.0000 mg | ORAL_TABLET | Freq: Two times a day (BID) | ORAL | Status: DC
  Administered 2014-11-29 – 2014-12-04 (×10): 150 mg via ORAL
  Filled 2014-11-29 (×14): qty 1

## 2014-11-29 MED ORDER — PRAVASTATIN SODIUM 80 MG PO TABS
80.0000 mg | ORAL_TABLET | Freq: Every day | ORAL | Status: DC
  Administered 2014-11-29 – 2014-12-03 (×5): 80 mg via ORAL
  Filled 2014-11-29 (×6): qty 1

## 2014-11-29 NOTE — Progress Notes (Signed)
  Radiation Oncology         [phone] ________________________________  Name: Cathy Meadows MRN: [id number]  Date: 11/29/2014  DOB: [birth year]  Chart Note:  I reviewed this patient's most recent findings and wanted to take a minute to document my impression.  Her brain films were presented in our multidisciplinary brain tumor conference earlier this week. 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 The consensus recommendation from the conference was to proceed with biopsy of the lung, either with bronchoscopy or CT-guided core needle biopsy. The final recommendations for the brain treatment would be determined by tumor histology.  If small cell lung cancer, the patient would be eligible for whole brain radiotherapy at [facility].  If non-small cell cancer, the patient may be eligible for preoperative stereotactic radiosurgery at [facility] followed by suboccipital craniectomy and cerebellar metastasis resection at [facility] versus stereotactic radiosurgery alone.  Dr. Ellene Route from neurosurgery has reviewed her case.  ________________________________  Sheral Apley. Tammi Klippel, M.D.

## 2014-11-29 NOTE — H&P (Addendum)
[facility] History and Physical  RETHA BITHER XVQ:[id number] DOB: [birth year] DOA: 11/29/2014  Referring physician: ED physician PCP: Ernestene Kiel, MD   Chief Complaint: dyspnea   HPI:  Patient is 68 year old female with history of bipolar disorder, hypertension, hyperlipidemia, diabetes, for your history of smoking a pack per day, anxiety and depression. She was transferred from [facility] to [facility] for further evaluation of hypoxia. Patient explains that over the past week she felt weak and tired, had difficulty standing up and ambulating. Her son checked her oxygen level at home and said it was low less than 60%. He took her to the hospital and in the emergency department patient explains her oxygen saturation was 54% and a placed mask on her face. Patient denies chest pain at this time but has noted some exertional shortness of breath. She denies shortness of breath at rest. She also denies similar events in the past, no fevers or chills, no recent sicknesses or exposures. Patient denies any specific abdominal concerns except constipation which is chronic in nature. She also denies any specific urinary concerns and reports she has chronic increased urinary frequency. Patient has oxygen at home and explains she usually uses 1 L oxygen with ambulation and has been on it for past 6-8 months.  Per record review, patient has seen her primary care doctor one week prior to this admission for anxiety and persistent cough. In addition there is report of frequent falls at home but patient denied that to me. She has had chest x-ray as part of the workup which showed possible elevation of the right diaphragm. CT of the chest showed there was 10 x 8.9 x 12.2 cm right lower lobe mass. There also were bilateral paraspinal masses, however, these were unchanged compared to a CT from 2012. She was also noted to have enlarged pulmonary arteries suggestive of pulmonary  hypertension. In addition, patient had, PET CT done which showed the lung mass to be hypermetabolic. There was no evidence of regional or distant metastases. She did have an incidentally noted indeterminate parotid lesion.   Assessment and Plan: Active Problems: Principal Problem:   Acute respiratory failure with hypoxia - possibly related to COPD and ? New lung ca as patient has a very long history of smoking - recent CT chest with right lower lobe mass, worrisome for malignancy - We'll repeat chest x-ray right now, provide bronchodilators scheduled and as needed - No signs of pneumonia based on physical exam and symptoms and will therefore hold off on antibiotics - Provide oxygen 2 L nasal cannula, provide antitussives if needed Active Problems:   Left cerebellar hemorrhagic mass - noted on recent MRA brain - reviewed per rad oncologist, plan for lung biopsy first and based on results further rec's to be provided  - will need to consult oncology team in AM   Diabetes mellitus without complication - We'll check A1c - Place on sliding scale insulin for now   Hypertension - Blood pressure stable on admission, continue home medical regimen   Bipolar disorder - Appears to be stable at this time   ? Lung cancer  - reviewed Dr. 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 - The consensus recommendation from the conference was to proceed with lung bx, with bronchoscopy or CT-guided core needle bx - final recommendations for the brain treatment would  be determined by tumor histology - will need to discuss with oncologist in AM  SCD's for DVT prophylaxis   Radiological Exams on Admission: No results found.   Code Status: Full Family Communication: Pt at bedside Disposition Plan: Admit for further evaluation     Review of  Systems:  Constitutional: Negative for diaphoresis.  HENT: Negative for hearing loss, ear pain, nosebleeds, congestion, sore throat, neck pain, tinnitus and ear discharge.   Eyes: Negative for blurred vision, double vision, photophobia, pain, discharge and redness.  Respiratory: Negative for wheezing and stridor.   Cardiovascular: Negative for chest pain, palpitations, orthopnea, claudication and leg swelling.  Gastrointestinal: Negative for nausea, vomiting and abdominal pain Genitourinary: Negative for dysuria, urgency, frequency, hematuria and flank pain.  Musculoskeletal: Negative for myalgias, back pain, joint pain and falls.  Skin: Negative for itching and rash.  Neurological: Negative for dizziness and weakness.  Endo/Heme/Allergies: Negative for environmental allergies and polydipsia. Does not bruise/bleed easily.  Psychiatric/Behavioral: Negative for suicidal ideas.     Past Medical History  Diagnosis Date  . Diabetes mellitus without complication   . Hypertension   . Renal disorder   . Cancer   . Bipolar disorder   . Osteoarthritis   . Fibromyalgia   . Glaucoma     right eye  . Hyperlipidemia   . Asthma   . Alcohol abuse     quit 2007 per patient  . Tobacco abuse   . Right lower lobe lung mass     1st noted 09/2014    Past Surgical History  Procedure Laterality Date  . Breast surgery right    . Parotidectomy    . Gallbladder surgery    . Abdominal hysterectomy      complete    Social History:  reports that she has been smoking Cigarettes.  She has a 80 pack-year smoking history. She does not have any smokeless tobacco history on file. She reports that she does not drink alcohol or use illicit drugs.  Allergies  Allergen Reactions  . Codeine Rash  . Penicillins Rash  . Robaxin [Methocarbamol] Rash  . Sulfa Antibiotics Rash  . Tizanidine Rash    Family History  Problem Relation Age of Onset  . Hypertension Father   . Cancer Father     skin  . Stroke  Father   . Heart disease Father   . Hypertension Mother   . Heart disease Mother   . Breast cancer Mother   . Cancer Mother     skin  . Stroke Mother     Prior to Admission medications   Medication Sig Start Date End Date Taking? Authorizing Provider  albuterol (PROVENTIL) (2.5 MG/3ML) 0.083% nebulizer solution Take 2.5 mg by nebulization every 6 (six) hours as needed for wheezing or shortness of breath.    Historical Provider, MD  amLODipine (NORVASC) 10 MG tablet Take 10 mg by mouth daily.    Historical Provider, MD  ammonium lactate (LAC-HYDRIN) 12 % lotion Apply 1 application topically 2 (two) times daily. To the affected areas    Historical Provider, MD  brimonidine (ALPHAGAN P) 0.1 % SOLN Place 1 drop into the right eye 2 (two) times daily.    Historical Provider, MD  cholecalciferol (VITAMIN D) 1000 UNITS tablet Take 1,000 Units by mouth daily.    Historical Provider, MD  cyclobenzaprine (FLEXERIL) 10 MG tablet Take 10 mg by mouth 3 (three) times daily as needed for muscle spasms.    Historical Provider, MD  escitalopram (LEXAPRO) 10 MG tablet Take 10 mg by mouth 2 (two) times daily.    Historical Provider, MD  fish oil-omega-3 fatty acids 1000 MG capsule Take 1 g by mouth 2 (two) times daily.    Historical Provider, MD  furosemide (LASIX) 20 MG tablet Take 20 mg by mouth daily as needed for fluid or edema.    Historical Provider, MD  lamoTRIgine (LAMICTAL) 150 MG tablet Take 150 mg by mouth 2 (two) times daily.    Historical Provider, MD  latanoprost (XALATAN) 0.005 % ophthalmic solution Place 1 drop into the right eye at bedtime.    Historical Provider, MD  LORazepam (ATIVAN) 1 MG tablet Take 1 mg by mouth 2 (two) times daily as needed for anxiety.    Historical Provider, MD  metFORMIN (GLUCOPHAGE-XR) 500 MG 24 hr tablet Take 500 mg by mouth daily with breakfast.    Historical Provider, MD  pioglitazone (ACTOS) 15 MG tablet Take 15 mg by mouth daily.    Historical Provider, MD   pravastatin (PRAVACHOL) 80 MG tablet Take 80 mg by mouth at bedtime.    Historical Provider, MD  valsartan (DIOVAN) 320 MG tablet Take 320 mg by mouth at bedtime.    Historical Provider, MD  vitamin B-12 (CYANOCOBALAMIN) 1000 MCG tablet Take 1,000 mcg by mouth daily.    Historical Provider, MD    Physical Exam: Filed Vitals:   11/29/14 1110  BP: 117/66  Pulse: 99  Temp: 98.3 F (36.8 C)  TempSrc: Oral  Resp: 18  Height: 5\' 2"  (1.575 m)  Weight: 69.5 kg (153 lb 3.5 oz)  SpO2: 99%    Physical Exam  Constitutional: Appears well-developed and well-nourished. No distress.  HENT: Normocephalic. External right and left ear normal. Oropharynx is clear and moist.  Eyes: Conjunctivae and EOM are normal. PERRLA, no scleral icterus.  Neck: Normal ROM. Neck supple. No JVD. No tracheal deviation. No thyromegaly.  CVS: RRR, S1/S2 +, no murmurs, no gallops, no carotid bruit.  Pulmonary: Effort and breath sounds normal, no stridor, rhonchi, wheezes, rales.  Abdominal: Soft. BS +,  no distension, tenderness, rebound or guarding.  Musculoskeletal: Normal range of motion. No edema and no tenderness.  Lymphadenopathy: No lymphadenopathy noted, cervical, inguinal. Neuro: Alert. Normal reflexes, muscle tone coordination. No cranial nerve deficit. Skin: Skin is warm and dry. No rash noted. Not diaphoretic. No erythema. No pallor.  Psychiatric: Normal mood and affect. Behavior, judgment, thought content normal.   Labs on Admission:  Basic Metabolic Panel: No results for input(s): NA, K, CL, CO2, GLUCOSE, BUN, CREATININE, CALCIUM, MG, PHOS in the last 168 hours. Liver Function Tests: No results for input(s): AST, ALT, ALKPHOS, BILITOT, PROT, ALBUMIN in the last 168 hours. No results for input(s): LIPASE, AMYLASE in the last 168 hours. No results for input(s): AMMONIA in the last 168 hours. CBC: No results for input(s): WBC, NEUTROABS, HGB, HCT, MCV, PLT in the last 168 hours. Cardiac Enzymes: No  results for input(s): CKTOTAL, CKMB, CKMBINDEX, TROPONINI in the last 168 hours. BNP: Invalid input(s): POCBNP CBG: No results for input(s): GLUCAP in the last 168 hours.  EKG: Normal sinus rhythm, no ST/T wave changes  Faye Ramsay, MD  [facility] Pager [phone]  If 7PM-7AM, please contact night-coverage [email or web address] Password [email or web address] 11/29/2014, 12:16 PM

## 2014-11-30 ENCOUNTER — Inpatient Hospital Stay (HOSPITAL_COMMUNITY): Payer: Medicare Other

## 2014-11-30 ENCOUNTER — Ambulatory Visit
Admit: 2014-11-30 | Discharge: 2014-11-30 | Disposition: A | Discharge status: Home / Self Care | Payer: Medicare Other | Attending: Radiation Oncology | Admitting: Radiation Oncology

## 2014-11-30 DIAGNOSIS — R918 Other nonspecific abnormal finding of lung field: Secondary | ICD-10-CM | POA: Insufficient documentation

## 2014-11-30 DIAGNOSIS — C3431 Malignant neoplasm of lower lobe, right bronchus or lung: Secondary | ICD-10-CM

## 2014-11-30 DIAGNOSIS — C7931 Secondary malignant neoplasm of brain: Secondary | ICD-10-CM

## 2014-11-30 LAB — BASIC METABOLIC PANEL
ANION GAP: 9 (ref 5–15)
BUN: 19 mg/dL (ref 6–23)
CALCIUM: 10.9 mg/dL — AB (ref 8.4–10.5)
CO2: 28 mEq/L (ref 19–32)
Chloride: 98 mEq/L (ref 96–112)
Creatinine, Ser: 0.63 mg/dL (ref 0.50–1.10)
GFR calc non Af Amer: >90 mL/min (ref >90)
Glucose, Bld: 112 mg/dL — ABNORMAL HIGH (ref 70–99)
Potassium: 4.4 mEq/L (ref 3.7–5.3)
Sodium: 135 mEq/L — ABNORMAL LOW (ref 137–147)

## 2014-11-30 LAB — URINALYSIS, ROUTINE W REFLEX MICROSCOPIC
Bilirubin Urine: NEGATIVE
GLUCOSE, UA: NEGATIVE mg/dL
Ketones, ur: NEGATIVE mg/dL
LEUKOCYTES UA: NEGATIVE
Nitrite: NEGATIVE
PH: 7 (ref 5.0–8.0)
PROTEIN: NEGATIVE mg/dL
SPECIFIC GRAVITY, URINE: 1.013 (ref 1.005–1.030)
Urobilinogen, UA: 0.2 mg/dL (ref 0.0–1.0)

## 2014-11-30 LAB — PROTIME-INR
INR: 1.01 (ref 0.00–1.49)
Prothrombin Time: 13.4 seconds (ref 11.6–15.2)

## 2014-11-30 LAB — URINE MICROSCOPIC-ADD ON

## 2014-11-30 LAB — GLUCOSE, CAPILLARY
GLUCOSE-CAPILLARY: 176 mg/dL — AB (ref 70–99)
Glucose-Capillary: 144 mg/dL — ABNORMAL HIGH (ref 70–99)

## 2014-11-30 LAB — CBC
HEMATOCRIT: 37.9 % (ref 36.0–46.0)
Hemoglobin: 11.5 g/dL — ABNORMAL LOW (ref 12.0–15.0)
MCH: 25.9 pg — ABNORMAL LOW (ref 26.0–34.0)
MCHC: 30.3 g/dL (ref 30.0–36.0)
MCV: 85.4 fL (ref 78.0–100.0)
Platelets: 408 10*3/uL — ABNORMAL HIGH (ref 150–400)
RBC: 4.44 MIL/uL (ref 3.87–5.11)
RDW: 17.9 % — AB (ref 11.5–15.5)
WBC: 14 10*3/uL — ABNORMAL HIGH (ref 4.0–10.5)

## 2014-11-30 LAB — APTT: aPTT: 27 seconds (ref 24–37)

## 2014-11-30 MED ORDER — DEXAMETHASONE SODIUM PHOSPHATE 4 MG/ML IJ SOLN: 6.0000 mg | Freq: Four times a day (QID) | INTRAMUSCULAR | Status: DC

## 2014-11-30 MED ORDER — PROMETHAZINE HCL 25 MG/ML IJ SOLN
12.5000 mg | Freq: Four times a day (QID) | INTRAMUSCULAR | Status: DC | PRN
  Administered 2014-11-30 – 2014-12-04 (×5): 12.5 mg via INTRAVENOUS
  Filled 2014-11-30 (×5): qty 1

## 2014-11-30 MED ORDER — ALBUTEROL SULFATE (2.5 MG/3ML) 0.083% IN NEBU
2.5000 mg | INHALATION_SOLUTION | Freq: Three times a day (TID) | RESPIRATORY_TRACT | Status: DC
  Administered 2014-11-30 – 2014-12-03 (×8): 2.5 mg via RESPIRATORY_TRACT
  Filled 2014-11-30 (×12): qty 3

## 2014-11-30 MED ORDER — DEXAMETHASONE SODIUM PHOSPHATE 10 MG/ML IJ SOLN
10.0000 mg | Freq: Once | INTRAMUSCULAR | Status: AC
  Administered 2014-11-30: 10 mg via INTRAVENOUS
  Filled 2014-11-30: qty 1

## 2014-11-30 MED ORDER — FENTANYL CITRATE 0.05 MG/ML IJ SOLN
INTRAMUSCULAR | Status: AC
  Filled 2014-11-30: qty 2

## 2014-11-30 MED ORDER — MIDAZOLAM HCL 2 MG/2ML IJ SOLN
INTRAMUSCULAR | Status: AC
  Filled 2014-11-30: qty 2

## 2014-11-30 MED ORDER — DEXAMETHASONE SODIUM PHOSPHATE 10 MG/ML IJ SOLN: 10.0000 mg | Freq: Four times a day (QID) | INTRAMUSCULAR | Status: DC

## 2014-11-30 MED ORDER — SODIUM CHLORIDE 0.9 % IV SOLN
INTRAVENOUS | Status: DC
  Administered 2014-11-30 (×2): via INTRAVENOUS

## 2014-11-30 MED ORDER — DEXAMETHASONE SODIUM PHOSPHATE 10 MG/ML IJ SOLN
6.0000 mg | Freq: Four times a day (QID) | INTRAMUSCULAR | Status: DC
  Administered 2014-11-30 – 2014-12-04 (×15): 6 mg via INTRAVENOUS
  Filled 2014-11-30: qty 1
  Filled 2014-11-30 (×4): qty 0.6
  Filled 2014-11-30: qty 1
  Filled 2014-11-30 (×12): qty 0.6

## 2014-11-30 MED ORDER — INSULIN ASPART 100 UNIT/ML ~~LOC~~ SOLN
0.0000 [IU] | Freq: Three times a day (TID) | SUBCUTANEOUS | Status: DC
  Administered 2014-11-30: 1 [IU] via SUBCUTANEOUS
  Administered 2014-12-01 (×2): 2 [IU] via SUBCUTANEOUS
  Administered 2014-12-01: 1 [IU] via SUBCUTANEOUS
  Administered 2014-12-02: 3 [IU] via SUBCUTANEOUS
  Administered 2014-12-02 – 2014-12-03 (×2): 2 [IU] via SUBCUTANEOUS
  Administered 2014-12-03: 3 [IU] via SUBCUTANEOUS
  Administered 2014-12-03 – 2014-12-05 (×6): 2 [IU] via SUBCUTANEOUS

## 2014-11-30 NOTE — Consult Note (Signed)
Reason for consult: right lung mass biopsy  Referring Physician(s): Drs. Myers/Hendrickson  History of Present Illness: Cathy Meadows is a 68 y.o. female with history of bipolar disorder, hypertension, hyperlipidemia, diabetes, smoking, anxiety and depression. She was transferred from [facility] to [facility] for further evaluation of hypoxia. Patient explains that over the past week she felt weak and tired with dizziness, and had difficulty standing up and ambulating. She was recently seen by Dr. Roxan Hockey regarding a hypermetabolic right lung mass and request is now received for CT guided biopsy. 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 Additional imaging findings included paravertebral ST densities, mild compression fx T12.   Past Medical History  Diagnosis Date  . Diabetes mellitus without complication   . Hypertension   . Renal disorder   . Cancer   . Bipolar disorder   . Osteoarthritis   . Fibromyalgia   . Glaucoma     right eye  . Hyperlipidemia   . Asthma   . Alcohol abuse     quit 2007 per patient  . Tobacco abuse   . Right lower lobe lung mass     1st noted 09/2014    Past Surgical History  Procedure Laterality Date  . Breast surgery right    . Parotidectomy    . Gallbladder surgery    . Abdominal hysterectomy      complete    Allergies: Codeine; Penicillins; Robaxin; Sulfa antibiotics; and Tizanidine  Medications: Prior to Admission medications   Medication Sig Start Date End Date Taking? Authorizing Provider  albuterol (PROVENTIL) (2.5 MG/3ML) 0.083% nebulizer solution Take 2.5 mg by nebulization every 6 (six) hours as needed for wheezing or shortness of breath.   Yes Historical Provider, MD  amLODipine (NORVASC) 10 MG tablet Take 10 mg by mouth daily.   Yes Historical Provider, MD  ammonium lactate (LAC-HYDRIN) 12 % lotion Apply 1 application topically 2 (two)  times daily. To the affected areas   Yes Historical Provider, MD  brimonidine (ALPHAGAN P) 0.1 % SOLN Place 1 drop into the right eye 2 (two) times daily.   Yes Historical Provider, MD  cholecalciferol (VITAMIN D) 1000 UNITS tablet Take 1,000 Units by mouth daily.   Yes Historical Provider, MD  cyclobenzaprine (FLEXERIL) 10 MG tablet Take 10 mg by mouth 3 (three) times daily as needed for muscle spasms.   Yes Historical Provider, MD  escitalopram (LEXAPRO) 10 MG tablet Take 10 mg by mouth 2 (two) times daily.   Yes Historical Provider, MD  fish oil-omega-3 fatty acids 1000 MG capsule Take 1 g by mouth 2 (two) times daily.   Yes Historical Provider, MD  furosemide (LASIX) 20 MG tablet Take 20 mg by mouth daily as needed for fluid or edema.   Yes Historical Provider, MD  lamoTRIgine (LAMICTAL) 150 MG tablet Take 150 mg by mouth 2 (two) times daily.   Yes Historical Provider, MD  latanoprost (XALATAN) 0.005 % ophthalmic solution Place 1 drop into the right eye at bedtime.   Yes Historical Provider, MD  LORazepam (ATIVAN) 1 MG tablet Take 1 mg by mouth 2 (two) times daily as needed for anxiety.   Yes Historical Provider, MD  metFORMIN (GLUCOPHAGE-XR) 500 MG 24 hr tablet Take 500 mg by mouth daily with breakfast.   Yes Historical Provider, MD  pioglitazone (ACTOS) 15 MG tablet Take 15 mg by mouth daily.   Yes Historical Provider, MD  pravastatin (PRAVACHOL) 80 MG tablet Take 80 mg by mouth at bedtime.   Yes Historical Provider, MD  valsartan (DIOVAN) 320 MG tablet Take 320 mg by mouth at bedtime.   Yes Historical Provider, MD  vitamin B-12 (CYANOCOBALAMIN) 1000 MCG tablet Take 1,000 mcg by mouth daily.   Yes Historical Provider, MD    Family History  Problem Relation Age of Onset  . Hypertension Father   . Cancer Father     skin  . Stroke Father   . Heart disease Father   . Hypertension Mother   . Heart disease Mother   . Breast cancer Mother   . Cancer Mother     skin  . Stroke Mother      History   Social History  . Marital Status: Legally Separated    Spouse Name: N/A    Number of Children: N/A  . Years of Education: N/A   Social History Main Topics  . Smoking status: Current Every Day Smoker -- 2.00 packs/day for 40 years    Types: Cigarettes  . Smokeless tobacco: None  . Alcohol Use: No     Comment: Former heavy drinker, quit 2007  . Drug Use: No     Comment: polypharmacy  . Sexual Activity: None   Other Topics Concern  . None   Social History Narrative         Review of Systems see above  Vital Signs: BP 119/63 mmHg  Pulse 77  Temp(Src) 98.1 F (36.7 C) (Oral)  Resp 15  Ht 5\' 2"  (1.575 m)  Wt 154 lb (69.854 kg)  BMI 28.16 kg/m2  SpO2 97%  Physical Exam pt awake/alert; c/o dizziness, recent N/V,HA; CHEST- dim BS rt base, left clear; heart- RRR; abd- soft,+BS, NT; ext- FROM, no edema  Imaging: Dg Chest 2 View  11/29/2014   CLINICAL DATA:  Dyspnea, weakness, hypertension, diabetes  EXAM: CHEST  2 VIEW  COMPARISON:  The 11/24/2014  FINDINGS: Enlargement of cardiac silhouette.  Mediastinal contours stable.  Calcified mildly tortuous thoracic aorta.  Again identified large opacity at RIGHT lung base corresponding to large known RIGHT lower lobe mass on CT measure 11.0 x 14.0 x 15.2 cm  Remaining lungs clear.  No pleural effusion or pneumothorax.  No acute osseous findings.  IMPRESSION: Large RIGHT lower lobe mass.  Enlargement of cardiac silhouette.  No acute infiltrate.   Electronically Signed   By: Lavonia Dana M.D.   On: 11/29/2014 15:06    Labs:  CBC:  Recent Labs  11/29/14 1312 11/30/14 0400  WBC 15.0* 14.0*  HGB 12.0 11.5*  HCT 38.8 37.9  PLT 467* 408*    COAGS:  Recent Labs  11/30/14 1054  INR 1.01  APTT 27    BMP:  Recent Labs  11/29/14 1312 11/30/14 0400  NA 137 135*  K 4.1 4.4  CL 98 98  CO2 25 28  GLUCOSE 126* 112*  BUN 20 19  CALCIUM 11.4* 10.9*  CREATININE 0.65 0.63  GFRNONAA 89* >90  GFRAA >90 >90     LIVER FUNCTION TESTS:  Recent Labs  11/29/14 1312  BILITOT <0.2*  AST 21  ALT 19  ALKPHOS 120*  PROT 7.1  ALBUMIN 3.0*    TUMOR MARKERS: No results for input(s): AFPTM, CEA, CA199, CHROMGRNA in the last 8760 hours.  Assessment and Plan: Pt with hx smoking, large right lower lobe hypermetabolic lung mass, hemorrhagic cerebellar mass. Imaging studies have been reviewed by Dr. Pascal Lux. Tent plan is  for CT guided biopsy of the lung mass this afternoon. Pt had few episodes of N/V this am and cont to be dizzy. If she has additional episodes of N/V today may need to postpone bx until this resolves due to need for IV conscious sedation/risk of aspiration. Details/risks of procedure d/w pt with her understanding and consent. Above plans also d/w Dr. Doyle Askew.   I spent a total of 20 minutes face to face in clinical consultation, greater than 50% of which was counseling/coordinating care for CT guided rt lung mass biopsy.  Signed: Autumn Messing 11/30/2014, 11:47 AM

## 2014-11-30 NOTE — Progress Notes (Signed)
I saw Mrs. Puthoff in the office last week regarding a large right lung mass. My initial impression was that she was a poor operative candidate but had ordered tests to better evaluate the issue. She was having difficulty with balance and falls, so one of tests ordered was an MR of the brain. She was admitted at [facility] before that test was done due to hypoxia. 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   She was transferred to [facility] for further evaluation. She was seen in consultation by Dr. Tammi Klippel for her brain metastasis.  She has been feeling poorly with nausea and vomiting. She was scheduled for a CT guided biopsy today but it was cancelled due to nausea.   Agree with plan for CT guided biopsy when patient can tolerate. Will need radiation +/- surgery for brain metastasis. She will need chemotherapy as well.  I do not think she will be a candidate for lung resection.  Please let me know if I can be of assistance with her care

## 2014-11-30 NOTE — Progress Notes (Signed)
Patient ID: Cathy Meadows, female   DOB: 1946-05-20, 68 y.o.   MRN: [id number] Unable to perform lung bx this afternoon due to pt's persistent N/V. Will cont to monitor and reschedule once stabilized. Nurse aware.

## 2014-11-30 NOTE — Progress Notes (Signed)
  Radiation Oncology         [phone] ________________________________  Name: Cathy Meadows MRN: [id number]  Date: 11/29/2014  DOB: May 27, 1946  Chart Note:  Appreciate IR consult and planned CT lung biopsy.  Given new brain metastasis with edema and nausea, I would recommend starting decadron 10 mg IV now, then 6 mg IV q6 hours, and switching to oral decadron regimen later.  ________________________________  Sheral Apley Tammi Klippel, M.D.

## 2014-11-30 NOTE — Progress Notes (Signed)
Clinical Social Work Department BRIEF PSYCHOSOCIAL ASSESSMENT 11/30/2014  Patient:  Cathy Meadows, Cathy Meadows     Account Number:  [id number]     Admit date:  11/29/2014  Clinical Social Worker:  Maryln Manuel  Date/Time:  11/30/2014 09:45 AM  Referred by:  RN  Date Referred:  11/30/2014 Referred for  Other - See comment   Other Referral:   other psychosocial needs   Interview type:  Family Other interview type:    PSYCHOSOCIAL DATA Living Status:  ALONE Admitted from facility:   Level of care:   Primary support name:  Louie Casa Lewis/son/[phone] Primary support relationship to patient:  CHILD, ADULT Degree of support available:   strong    CURRENT CONCERNS Current Concerns  Other - See comment   Other Concerns:    SOCIAL WORK ASSESSMENT / PLAN CSW received notification from RN that pt son wishing to speak with CSW.    CSW met with pt son in hallway while pt receiving nursing care. Pt son inquired about insurance coverage for hospital because pt transferred from [facility] and pt son wanted to ensure that coverage would continue. CSW discussed that RNCM is the person who reviews pt insurance and CSW will notify RNCM of pt son's questions in order for [facility] to address.    Pt son discussed that pt may need rehab at [facility] following hospitalization. CSW discussed that MD will order PT in order to evaluate pt needs. CSW reviewed insurance coverage for rehab with pt son upon his request. CSW provided pt son CSW contact information and discussed that CSW will continue to follow along and assist with disposition to rehab at SNF if needed.    CSW provided support as pt son discussed that he is trying to keep pt family members updated, but pt family expresses frustration about not getting answers quick enough. Pt son shared that pt is going to have biopsy done in order for oncology to determine treatment plan. Pt son shared that he is trying to balance being a strong support to pt and  professional obligations. CSW validated pt sons feelings.    CSW notified RNCM of pt son's questions regarding insurance coverage for hospital stay.    CSW to continue to follow and review PT recommendations once PT evaluation completed.    CSW to continue to follow to provide support and assist with pt disposition needs as appropriate.   Assessment/plan status:  Psychosocial Support/Ongoing Assessment of Needs Other assessment/ plan:   discharge planning   Information/referral to community resources:   referral to [facility]. other referrals pending at this time.    PATIENT'S/FAMILY'S RESPONSE TO PLAN OF CARE: Per chart, pt oriented to person only. Pt son supportive and actively involved in pt care. Pt son is eager to get more definitive answers and treatment plan in order to update pt family. Pt son aware of potential need for pt to have rehab at [facility] upon discharge and aware that CSW will follow up once recommendations available from MD and PT.   Alison Murray, MSW, [place] [phone]

## 2014-11-30 NOTE — Progress Notes (Addendum)
Patient ID: Cathy Meadows, female   DOB: 07-28-1946, 68 y.o.   MRN: [id number]  [facility] PROGRESS NOTE  CELE MOTE KXF:[id number] DOB: [birth year] DOA: 11/29/2014 PCP: Ernestene Kiel, MD  Brief narrative:  HPI:  Patient is 68 year old female with history of bipolar disorder, hypertension, hyperlipidemia, diabetes, for your history of smoking a pack per day, anxiety and depression. She was transferred from [facility] to [facility] for further evaluation of hypoxia. Patient explains that over the past week she felt weak and tired, had difficulty standing up and ambulating. Her son checked her oxygen level at home and said it was low less than 60%. He took her to the hospital and in the emergency department patient explains her oxygen saturation was 54% and a placed mask on her face. Patient denies chest pain at this time but has noted some exertional shortness of breath. She denies shortness of breath at rest. She also denies similar events in the past, no fevers or chills, no recent sicknesses or exposures. Patient denies any specific abdominal concerns except constipation which is chronic in nature. She also denies any specific urinary concerns and reports she has chronic increased urinary frequency. Patient has oxygen at home and explains she usually uses 1 L oxygen with ambulation and has been on it for past 6-8 months.  Per record review, patient has seen her primary care doctor one week prior to this admission for anxiety and persistent cough. In addition there is report of frequent falls at home but patient denied that to me. She has had chest x-ray as part of the workup which showed possible elevation of the right diaphragm. CT of the chest showed there was 10 x 8.9 x 12.2 cm right lower lobe mass. There also were bilateral paraspinal masses, however, these were unchanged compared to a CT from 2012. She was also noted to have enlarged pulmonary arteries  suggestive of pulmonary hypertension. In addition, patient had, PET CT done which showed the lung mass to be hypermetabolic. There was no evidence of regional or distant metastases. She did have an incidentally noted indeterminate parotid lesion.  Assessment and Plan: Active Problems: Principal Problem:  Acute respiratory failure with hypoxia - possibly related to COPD and ? New lung ca as patient has a very long history of smoking - recent CT chest with right lower lobe mass, worrisome for malignancy - IR consult for consideration of lung biopsy  - Provide oxygen 2 L nasal cannula, provide antitussives if needed Active Problems:  ? Lung cancer  - reviewed Dr. 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 - proceed with lung bx, with CT-guided core needle bx, appreciate IR assistance  - final recommendations for the brain treatment would be determined by tumor histology - d/w Dr. Julien Nordmann, plan for referral once biopsy results back   Left cerebellar hemorrhagic mass - noted on recent MRA brain - reviewed per rad oncologist, plan for lung biopsy first and based on results further rec's to be provided  - will need to consult oncology team in AM   Leukocytosis - unclear etiology, possibly related the above problems - no clear signs of infectious etiology, WBC is trending down, repeat CBC in AM   Nausea and vomiting  - Am 12/16 - will provide antiemetics, will likely need CT abd after the lung biopsy for further evaluation  Diabetes mellitus without complication - L4Y 6.4 - Placed on sliding scale insulin for now  Hypertension - Blood pressure stable on admission, continue home medical regimen Norvasc and irbesartan   Bipolar disorder - Appears to be stable at this time   Hypercalcemia - provide IVF and repeat BMP in  AM - Ca is trending down: 11.4 --> 10.9  DVT prophylaxis  SCD  Code Status: Full Family Communication: Pt at bedside Disposition Plan: Home when medically stable   IV Access:   Peripheral IV Procedures and diagnostic studies:    Dg Chest 2 View  11/29/2014   Large RIGHT lower lobe mass.  Enlargement of cardiac silhouette.  No acute infiltrate.    Medical Consultants:   IR Other Consultants:   None   Anti-Infectives:   None   Faye Ramsay, MD  [facility] Pager [phone]  If 7PM-7AM, please contact night-coverage [email or web address] Password [facility] 11/30/2014, 6:47 AM   LOS: 1 day   HPI/Subjective: No events overnight.   Objective: Filed Vitals:   11/29/14 1110 11/29/14 2008 11/30/14 0450  BP: 117/66  119/63  Pulse: 99 88 77  Temp: 98.3 F (36.8 C)  98.1 F (36.7 C)  TempSrc: Oral  Oral  Resp: 18 18 15   Height: 5\' 2"  (1.575 m)    Weight: 69.5 kg (153 lb 3.5 oz)  69.854 kg (154 lb)  SpO2: 99% 98% 98%    Intake/Output Summary (Last 24 hours) at 11/30/14 0647 Last data filed at 11/30/14 0450  Gross per 24 hour  Intake    600 ml  Output    950 ml  Net   -350 ml    Exam:   General:  Pt is alert, follows commands appropriately, not in acute distress  Cardiovascular: Regular rate and rhythm, S1/S2, no murmurs, no rubs, no gallops  Respiratory: Clear to auscultation bilaterally, scattered rhonchi on the right side   Abdomen: Soft, non tender, non distended, bowel sounds present, no guarding  Extremities: pulses DP and PT palpable bilaterally  Neuro: Grossly nonfocal  Data Reviewed: Basic Metabolic Panel:  Recent Labs Lab 11/29/14 1312 11/30/14 0400  NA 137 135*  K 4.1 4.4  CL 98 98  CO2 25 28  GLUCOSE 126* 112*  BUN 20 19  CREATININE 0.65 0.63  CALCIUM 11.4* 10.9*  MG 1.8  --   PHOS 2.6  --    Liver Function Tests:  Recent Labs Lab 11/29/14 1312  AST 21  ALT 19  ALKPHOS 120*  BILITOT <0.2*  PROT 7.1  ALBUMIN 3.0*   CBC:  Recent  Labs Lab 11/29/14 1312 11/30/14 0400  WBC 15.0* 14.0*  HGB 12.0 11.5*  HCT 38.8 37.9  MCV 84.9 85.4  PLT 467* 408*     Scheduled Meds: . albuterol  2.5 mg Nebulization QID  . amLODipine  10 mg Oral Daily  . escitalopram  10 mg Oral BID  . irbesartan  75 mg Oral Daily  . lamoTRIgine  150 mg Oral BID  . latanoprost  1 drop Right Eye QHS  . pravastatin  80 mg Oral QHS  . sodium chloride  3 mL Intravenous Q12H   Continuous Infusions:

## 2014-11-30 NOTE — Progress Notes (Signed)
IR unable to do biopsy at this time, patient is nauseous.

## 2014-12-01 ENCOUNTER — Inpatient Hospital Stay (HOSPITAL_COMMUNITY): Payer: Medicare Other

## 2014-12-01 DIAGNOSIS — E43 Unspecified severe protein-calorie malnutrition: Secondary | ICD-10-CM | POA: Diagnosis present

## 2014-12-01 LAB — BASIC METABOLIC PANEL WITH GFR
Anion gap: 13 (ref 5–15)
BUN: 17 mg/dL (ref 6–23)
CO2: 26 meq/L (ref 19–32)
Calcium: 10.4 mg/dL (ref 8.4–10.5)
Chloride: 97 meq/L (ref 96–112)
Creatinine, Ser: 0.59 mg/dL (ref 0.50–1.10)
GFR calc Af Amer: >90 mL/min
GFR calc non Af Amer: >90 mL/min
Glucose, Bld: 196 mg/dL — ABNORMAL HIGH (ref 70–99)
Potassium: 4.2 meq/L (ref 3.7–5.3)
Sodium: 136 meq/L — ABNORMAL LOW (ref 137–147)

## 2014-12-01 LAB — GLUCOSE, CAPILLARY
GLUCOSE-CAPILLARY: 184 mg/dL — AB (ref 70–99)
Glucose-Capillary: 156 mg/dL — ABNORMAL HIGH (ref 70–99)
Glucose-Capillary: 126 mg/dL — ABNORMAL HIGH (ref 70–99)
Glucose-Capillary: 222 mg/dL — ABNORMAL HIGH (ref 70–99)

## 2014-12-01 LAB — CBC
HCT: 37.7 % (ref 36.0–46.0)
Hemoglobin: 11.3 g/dL — ABNORMAL LOW (ref 12.0–15.0)
MCH: 25.9 pg — ABNORMAL LOW (ref 26.0–34.0)
MCHC: 30 g/dL (ref 30.0–36.0)
MCV: 86.3 fL (ref 78.0–100.0)
Platelets: 373 K/uL (ref 150–400)
RBC: 4.37 MIL/uL (ref 3.87–5.11)
RDW: 18 % — ABNORMAL HIGH (ref 11.5–15.5)
WBC: 13 K/uL — ABNORMAL HIGH (ref 4.0–10.5)

## 2014-12-01 MED ORDER — FENTANYL CITRATE 0.05 MG/ML IJ SOLN
INTRAMUSCULAR | Status: AC
  Filled 2014-12-01: qty 4

## 2014-12-01 MED ORDER — FENTANYL CITRATE 0.05 MG/ML IJ SOLN
INTRAMUSCULAR | Status: AC | PRN
  Administered 2014-12-01: 25 ug via INTRAVENOUS

## 2014-12-01 MED ORDER — MIDAZOLAM HCL 2 MG/2ML IJ SOLN
INTRAMUSCULAR | Status: AC | PRN
  Administered 2014-12-01: 1 mg via INTRAVENOUS

## 2014-12-01 MED ORDER — MIDAZOLAM HCL 2 MG/2ML IJ SOLN
INTRAMUSCULAR | Status: AC
  Filled 2014-12-01: qty 4

## 2014-12-01 NOTE — Progress Notes (Signed)
Patient ID: Cathy Meadows, female   DOB: 26-Mar-1946, 68 y.o.   MRN: [id number]  [facility] PROGRESS NOTE  MONAI HINDES QQI:[id number] DOB: [birth year] DOA: 11/29/2014 PCP: Ernestene Kiel, MD   HPI:  Patient is 68 year old female with history of bipolar disorder, hypertension, hyperlipidemia, diabetes, for your history of smoking a pack per day, anxiety and depression. She was transferred from [facility] to [facility] for further evaluation of hypoxia. Patient explains that over the past week she felt weak and tired, had difficulty standing up and ambulating. Her son checked her oxygen level at home and said it was low less than 60%. He took her to the hospital and in the emergency department patient explains her oxygen saturation was 54% and a placed mask on her face. Patient denies chest pain at this time but has noted some exertional shortness of breath. She denies shortness of breath at rest. She also denies similar events in the past, no fevers or chills, no recent sicknesses or exposures. Patient denies any specific abdominal concerns except constipation which is chronic in nature. She also denies any specific urinary concerns and reports she has chronic increased urinary frequency. Patient has oxygen at home and explains she usually uses 1 L oxygen with ambulation and has been on it for past 6-8 months.  Per record review, patient has seen her primary care doctor one week prior to this admission for anxiety and persistent cough. In addition there is report of frequent falls at home but patient denied that to me. She has had chest x-ray as part of the workup which showed possible elevation of the right diaphragm. CT of the chest showed there was 10 x 8.9 x 12.2 cm right lower lobe mass. There also were bilateral paraspinal masses, however, these were unchanged compared to a CT from 2012. She was also noted to have enlarged pulmonary arteries suggestive of pulmonary  hypertension. In addition, patient had, PET CT done which showed the lung mass to be hypermetabolic. There was no evidence of regional or distant metastases. She did have an incidentally noted indeterminate parotid lesion.  Assessment and Plan: Active Problems: Principal Problem:  Acute respiratory failure with hypoxia - related to COPD and New lung ca as patient has a very long history of smoking - recent CT chest with right lower lobe mass, likely malignant  - IR consulted for CT guided biopsy, done today 12/17, follow up on biopsy results - Provide oxygen 2 L nasal cannula, provide antitussives if needed - per cardiothoracic surgery, pt most likely not candidate for lung resection - will need chemo eventually but we ned to first get biopsy results  Active Problems:  Lung cancer  - reviewed Dr. 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 - proceed with lung bx, with CT-guided core needle bx, appreciate IR assistance (12/17) - final recommendations for the brain treatment would be determined by tumor histology - d/w Dr. Julien Nordmann, plan for consultation once biopsy results back   Left cerebellar hemorrhagic mass - noted on recent MRA brain - reviewed per rad oncologist, plan for lung biopsy first and based on results further rec's to be provided    Nausea and vomiting - worrisome given brain mets  - placed on Decadron as recommended by Rad Onc - pt reports feeling better this AM, denies nausea but reports poor oral  intake  - abd slightly distended, will plan for Ct abd and pelvis today   Leukocytosis - unclear etiology, possibly related the above problems - no clear signs of infectious etiology, WBC is trending down, repeat CBC in AM  Diabetes mellitus without complication - I5O 6.4 - Placed on sliding scale insulin  for now  Hypertension - Blood pressure stable on admission, continue home medical regimen Norvasc and irbesartan   Bipolar disorder - Appears to be stable at this time  Hypercalcemia - provide IVF and repeat BMP in AM - Ca is trending down: 11.4 --> 10.9 --> 10.4   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 Enlargement of cardiac silhouette. No acute infiltrate.  Medical Consultants:    IR  Rad Onc Other Consultants:    None  Anti-Infectives:    None   Faye Ramsay, MD  [facility] Pager [phone]  If 7PM-7AM, please contact night-coverage [email or web address] Password [email or web address] 12/01/2014, 3:18 PM   LOS: 2 days   HPI/Subjective: No events overnight. Pt reports feeling tired but denies nausea.   Objective: Filed Vitals:   12/01/14 1449 12/01/14 1457 12/01/14 1501 12/01/14 1505  BP: 130/64 122/56 122/58 120/63  Pulse: 91 95 92 90  Temp:      TempSrc:      Resp: 20 20 18 21   Height:      Weight:      SpO2:  96% 98% 97%    Intake/Output Summary (Last 24 hours) at 12/01/14 1518 Last data filed at 12/01/14 0600  Gross per 24 hour  Intake  819.5 ml  Output    450 ml  Net  369.5 ml    Exam:   General:  Pt is alert, follows commands appropriately, not in acute distress  Cardiovascular: Regular rate and rhythm, S1/S2, no murmurs, no rubs, no gallops  Respiratory: Clear to auscultation bilaterally, no wheezing, diminished breath sounds at bases   Abdomen: Soft, non tender, slightly distended, bowel sounds present, no guarding  Extremities: No edema, pulses DP and PT palpable bilaterally  Data Reviewed: Basic Metabolic Panel:  Recent Labs Lab 11/29/14 1312  11/30/14 0400 12/01/14 0437  NA 137 135* 136*  K 4.1 4.4 4.2  CL 98 98 97  CO2 25 28 26   GLUCOSE 126* 112* 196*  BUN 20 19 17   CREATININE 0.65 0.63 0.59  CALCIUM 11.4* 10.9* 10.4  MG 1.8  --   --   PHOS 2.6  --   --    Liver Function Tests:  Recent Labs Lab 11/29/14 1312  AST 21  ALT 19  ALKPHOS 120*  BILITOT <0.2*  PROT 7.1  ALBUMIN 3.0*   CBC:  Recent Labs Lab 11/29/14 1312 11/30/14 0400 12/01/14 0437  WBC 15.0* 14.0* 13.0*  HGB 12.0 11.5* 11.3*  HCT 38.8 37.9 37.7  MCV 84.9 85.4 86.3  PLT 467* 408* 373   CBG:  Recent Labs Lab 11/30/14 1222 11/30/14 1749 12/01/14 0750 12/01/14 1131  GLUCAP 176* 144* 156* 126*   Scheduled Meds: . albuterol  2.5 mg Nebulization TID  . amLODipine  10 mg Oral Daily  . dexamethasone  6 mg Intravenous 4 times per day  . escitalopram  10  mg Oral BID  . fentaNYL      . insulin aspart  0-9 Units Subcutaneous TID WC  . irbesartan  75 mg Oral Daily  . lamoTRIgine  150 mg Oral BID  . latanoprost  1 drop Right Eye QHS  . midazolam      . pravastatin  80 mg Oral QHS  . sodium chloride  3 mL Intravenous Q12H   Continuous Infusions: . sodium chloride 75 mL/hr at 11/30/14 2353

## 2014-12-01 NOTE — Progress Notes (Signed)
INITIAL NUTRITION ASSESSMENT  DOCUMENTATION CODES Per approved criteria  -Severe malnutrition in the context of chronic illness  Pt meets criteria for severe MALNUTRITION in the context of chronic illness as evidenced by 16% weight loss x 5 months and energy intake <75% for >1 month.  INTERVENTION: -Diet advancement per MD -When diet is advanced & appropriate, order Ensure Pudding po TID, each supplement provides 170 kcal and 4 grams of protein -RD to continue to monitor  NUTRITION DIAGNOSIS: Inadequate oral intake related to lung cancer with mets as evidenced by 16% weight loss x 5 months.   Goal: Pt to meet >/= 90% of their estimated nutrition needs   Monitor:  Diet advancement, weight, labs, I/O's  Reason for Assessment: Pt identified as at nutrition risk on the Malnutrition Screen Tool  Admitting Dx: Acute respiratory failure with hypoxia  ASSESSMENT: Patient is 68 year old female with history of bipolar disorder, hypertension, hyperlipidemia, diabetes, for your history of smoking a pack per day, anxiety and depression. Right lung mass with new brain metastasis with edema and nausea.  Per MD note, pt has been unable to have CT guided lung mass biopsy d/t N/V. Pt is currently NPO.  Pt reports still feeling nauseous. When diet was regular, pt was eating 75-100%. Pt reports 20+ weight loss since the summertime, UBW of 184 lb (16% wt loss x 5 months, significant for time frame). States her appetite has not been well for a few weeks.  Pt would like to receive nutritional supplements when diet is advanced. RD to order Ensure pudding when appropriate.  Unable to perform nutrition focused physical exam d/t pt wanting to stay covered up and not feeling well.  Labs reviewed: Low Na Glucose 196 Mg/Phos/K WNL  Height: Ht Readings from Last 1 Encounters:  11/29/14 5\' 2"  (1.575 m)    Weight: Wt Readings from Last 1 Encounters:  12/01/14 158 lb 8.2 oz (71.9 kg)    Ideal Body  Weight: 110 lb  % Ideal Body Weight: 144%  Wt Readings from Last 10 Encounters:  12/01/14 158 lb 8.2 oz (71.9 kg)  11/22/14 164 lb (74.39 kg)  07/08/13 190 lb (86.183 kg)    Usual Body Weight: 184 lb  % Usual Body Weight: 86%  BMI:  Body mass index is 28.98 kg/(m^2).  Estimated Nutritional Needs: Kcal: 2100-2300 Protein: 100-110g Fluid: 2.1L/day  Skin: ecchymosis  Diet Order: Diet NPO time specified Except for: Sips with Meds  EDUCATION NEEDS: -No education needs identified at this time   Intake/Output Summary (Last 24 hours) at 12/01/14 1156 Last data filed at 12/01/14 0600  Gross per 24 hour  Intake  819.5 ml  Output    950 ml  Net -130.5 ml    Last BM: 12/12  Labs:   Recent Labs Lab 11/29/14 1312 11/30/14 0400 12/01/14 0437  NA 137 135* 136*  K 4.1 4.4 4.2  CL 98 98 97  CO2 25 28 26   BUN 20 19 17   CREATININE 0.65 0.63 0.59  CALCIUM 11.4* 10.9* 10.4  MG 1.8  --   --   PHOS 2.6  --   --   GLUCOSE 126* 112* 196*    CBG (last 3)   Recent Labs  11/30/14 1749 12/01/14 0750 12/01/14 1131  GLUCAP 144* 156* 126*    Scheduled Meds: . albuterol  2.5 mg Nebulization TID  . amLODipine  10 mg Oral Daily  . dexamethasone  6 mg Intravenous 4 times per day  . escitalopram  10 mg Oral BID  . insulin aspart  0-9 Units Subcutaneous TID WC  . irbesartan  75 mg Oral Daily  . lamoTRIgine  150 mg Oral BID  . latanoprost  1 drop Right Eye QHS  . pravastatin  80 mg Oral QHS  . sodium chloride  3 mL Intravenous Q12H    Continuous Infusions: . sodium chloride 75 mL/hr at 11/30/14 2353    Past Medical History  Diagnosis Date  . Diabetes mellitus without complication   . Hypertension   . Renal disorder   . Cancer   . Bipolar disorder   . Osteoarthritis   . Fibromyalgia   . Glaucoma     right eye  . Hyperlipidemia   . Asthma   . Alcohol abuse     quit 2007 per patient  . Tobacco abuse   . Right lower lobe lung mass     1st noted 09/2014     Past Surgical History  Procedure Laterality Date  . Breast surgery right    . Parotidectomy    . Gallbladder surgery    . Abdominal hysterectomy      complete    Clayton Bibles, MS, RD, LDN Pager: [phone] After Hours Pager: [phone]

## 2014-12-01 NOTE — Progress Notes (Signed)
PT Cancellation Note  Patient Details Name: ZAKYA HALABI MRN: [id number] DOB: 1946-06-02   Cancelled Treatment:    Reason Eval/Treat Not Completed: Patient declined, no reason specified. Pt waiting for biopsy and worried that if she moves she will vomit and then she can't have the test, as that happened yesterday.  Will check back on pt as schedule permits.   Daanya Lanphier LUBECK 12/01/2014, 12:29 PM

## 2014-12-01 NOTE — Procedures (Signed)
CT guided core biopsies of right lung mass.  3 cores obtained.  No immediate complication.

## 2014-12-02 LAB — COMPREHENSIVE METABOLIC PANEL
ALT: 14 U/L (ref 0–35)
ANION GAP: 12 (ref 5–15)
AST: 13 U/L (ref 0–37)
Albumin: 2.5 g/dL — ABNORMAL LOW (ref 3.5–5.2)
Alkaline Phosphatase: 102 U/L (ref 39–117)
BUN: 20 mg/dL (ref 6–23)
CHLORIDE: 98 meq/L (ref 96–112)
CO2: 25 meq/L (ref 19–32)
Calcium: 10.3 mg/dL (ref 8.4–10.5)
Creatinine, Ser: 0.53 mg/dL (ref 0.50–1.10)
GFR calc Af Amer: >90 mL/min (ref >90)
Glucose, Bld: 194 mg/dL — ABNORMAL HIGH (ref 70–99)
Potassium: 4.2 mEq/L (ref 3.7–5.3)
Sodium: 135 mEq/L — ABNORMAL LOW (ref 137–147)
Total Protein: 6.4 g/dL (ref 6.0–8.3)

## 2014-12-02 LAB — CBC
HEMATOCRIT: 35.5 % — AB (ref 36.0–46.0)
HEMOGLOBIN: 11.2 g/dL — AB (ref 12.0–15.0)
MCH: 26.5 pg (ref 26.0–34.0)
MCHC: 31.5 g/dL (ref 30.0–36.0)
MCV: 84.1 fL (ref 78.0–100.0)
Platelets: 340 10*3/uL (ref 150–400)
RBC: 4.22 MIL/uL (ref 3.87–5.11)
RDW: 17.8 % — ABNORMAL HIGH (ref 11.5–15.5)
WBC: 16.2 10*3/uL — AB (ref 4.0–10.5)

## 2014-12-02 LAB — GLUCOSE, CAPILLARY
GLUCOSE-CAPILLARY: 253 mg/dL — AB (ref 70–99)
GLUCOSE-CAPILLARY: 214 mg/dL — AB (ref 70–99)
Glucose-Capillary: 188 mg/dL — ABNORMAL HIGH (ref 70–99)
Glucose-Capillary: 233 mg/dL — ABNORMAL HIGH (ref 70–99)

## 2014-12-02 MED ORDER — POLYETHYLENE GLYCOL 3350 17 G PO PACK
17.0000 g | PACK | Freq: Two times a day (BID) | ORAL | Status: DC
  Administered 2014-12-02: 17 g via ORAL
  Filled 2014-12-02 (×8): qty 1

## 2014-12-02 MED ORDER — SENNOSIDES-DOCUSATE SODIUM 8.6-50 MG PO TABS
1.0000 | ORAL_TABLET | Freq: Two times a day (BID) | ORAL | Status: DC
  Administered 2014-12-02 – 2014-12-03 (×2): 1 via ORAL
  Filled 2014-12-02 (×7): qty 1

## 2014-12-02 NOTE — Evaluation (Signed)
Physical Therapy Evaluation Patient Details Name: Cathy Meadows MRN: [id number] DOB: Oct 20, 1946 Today's Date: 12/02/2014   History of Present Illness  Patient is 68 year old female with history of bipolar disorder, hypertension, hyperlipidemia, diabetes, for your history of smoking a pack per day, anxiety and depression. She was transferred from [facility] to [facility] for further evaluation of hypoxia. Patient explains that over the past week she felt weak and tired, had difficulty standing up and ambulating. 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  Clinical Impression  Pt admitted with above diagnosis. Pt currently with functional limitations due to the deficits listed below (see PT Problem List).  Pt will benefit from skilled PT to increase their independence and safety with mobility to allow discharge to the venue listed below.  Pt unable to tolerate more than a SPT, but pt reports this was the first time she has gotten up in a week.  Pt with minimal posterior lean but reports she has had 20 falls in the last 6 months.   Based on not having 24 hour S and number of falls she has had, recommend SNF for more rehab after d/c from acute care.     Follow Up Recommendations SNF;Supervision/Assistance - 24 hour    Equipment Recommendations  None recommended by PT    Recommendations for Other Services OT consult     Precautions / Restrictions Precautions Precautions: Fall Precaution Comments: Pt reports ~ 20 falls in last 6 months Restrictions Weight Bearing Restrictions: No      Mobility  Bed Mobility Overal bed mobility: Needs Assistance Bed Mobility: Rolling;Sidelying to Sit Rolling: Min assist Sidelying to sit: Min assist       General bed mobility comments: Pt moves slowly and guarded due to fear of dizziness and falling.  Transfers Overall transfer level: Needs assistance   Transfers:  Sit to/from Stand;Stand Pivot Transfers Sit to Stand: +2 physical assistance;Min assist Stand pivot transfers: Min assist;+2 physical assistance       General transfer comment: Pt stood to RW and performed SPT with RW from bed > recliner with slight posterior lean and was ready to sit after just a SPT.  Ambulation/Gait             General Gait Details: Pt unable to ambulate more than SPT due to feeling "off".    Stairs            Wheelchair Mobility    Modified Rankin (Stroke Patients Only)       Balance Overall balance assessment: Needs assistance Sitting-balance support: No upper extremity supported Sitting balance-Leahy Scale: Fair   Postural control: Posterior lean Standing balance support: Bilateral upper extremity supported Standing balance-Leahy Scale: Poor Standing balance comment: Posterior lean and very guarded.  Not able to turn head.                             Pertinent Vitals/Pain Pain Assessment: No/denies pain    Home Living Family/patient expects to be discharged to:: Skilled nursing facility Living Arrangements: Alone Available Help at Discharge: Friend(s);Available PRN/intermittently Type of Home: House Home Access: Stairs to enter   Entrance Stairs-Number of Steps: 4 Home Layout: Two level;1/2 bath on main level Home Equipment: Walker - 4 wheels;Cane - single point      Prior Function           Comments: Amb with RW but was  having more difficulty     Hand Dominance        Extremity/Trunk Assessment   Upper Extremity Assessment: Overall WFL for tasks assessed           Lower Extremity Assessment: Overall WFL for tasks assessed         Communication      Cognition Arousal/Alertness: Awake/alert Behavior During Therapy: WFL for tasks assessed/performed Overall Cognitive Status: Within Functional Limits for tasks assessed                      General Comments      Exercises         Assessment/Plan    PT Assessment Patient needs continued PT services  PT Diagnosis Difficulty walking   PT Problem List Decreased strength;Decreased activity tolerance;Decreased balance;Decreased mobility;Decreased knowledge of use of DME  PT Treatment Interventions Gait training;Functional mobility training;Therapeutic activities;Therapeutic exercise;Balance training   PT Goals (Current goals can be found in the Care Plan section) Acute Rehab PT Goals PT Goal Formulation: With patient Time For Goal Achievement: 12/16/14 Potential to Achieve Goals: Good    Frequency Min 3X/week   Barriers to discharge        Co-evaluation               End of Session Equipment Utilized During Treatment: Gait belt Activity Tolerance: Patient limited by fatigue Patient left: in chair;with call bell/phone within reach;with chair alarm set Nurse Communication: Mobility status         Time: 4259-5638 PT Time Calculation (min) (ACUTE ONLY): 18 min   Charges:   PT Evaluation $Initial PT Evaluation Tier I: 1 Procedure PT Treatments $Therapeutic Activity: 8-22 mins   PT G Codes:          Valentin Benney LUBECK 12/02/2014, 9:20 AM

## 2014-12-02 NOTE — Care Management Note (Signed)
CARE MANAGEMENT NOTE 12/02/2014  Patient:  Cathy Meadows, Cathy Meadows   Account Number:  [id number]  Date Initiated:  12/02/2014  Documentation initiated by:  Marney Doctor  Subjective/Objective Assessment:   68 yo admitted with Acute respiratory failure with hypoxia     Action/Plan:   From home alone   Anticipated DC Date:  12/07/2014   Anticipated DC Plan:  SKILLED NURSING FACILITY  In-house referral  Clinical Social Worker      DC Planning Services  CM consult      Choice offered to / List presented to:             Status of service:  In process, will continue to follow Medicare Important Message given?   (If response is "NO", the following Medicare IM given date fields will be blank) Date Medicare IM given:   Medicare IM given by:   Date Additional Medicare IM given:   Additional Medicare IM given by:    Discharge Disposition:    Per UR Regulation:  Reviewed for med. necessity/level of care/duration of stay  If discussed at [place] of Stay Meetings, dates discussed:    Comments:  12/02/14 Marney Doctor RN,BSN,NCM [phone] Chart reviewed and CM following for DC needs.

## 2014-12-02 NOTE — Progress Notes (Signed)
CSW received notification that PT evaluation recommending short term rehab at [facility] and pt interested in [facility] in [place].   CSW met with pt and pt son, Louie Casa at length at bedside. CSW introduced self and explained role. CSW provided supportive listening as pt son discussed that pt had biopsy yesterday and pt and pt family awaiting results of biopsy to know pt treatment plan for cancer. Pt and pt son aware that pt would not be able to return home alone and agreeable to rehab at [facility]. Pt lives in [place] and pt and pt family hopeful for [facility] and agreeable to CSW initiating SNF search to [facility].   CSW completed FL2 and initiated SNF search to [facility]. CSW contacted facility and discussed pt interest in facility. Per facility, facility can accept pt, but pt family would be responsible to arranging pt transportation when pt treatment plan was determined.   CSW discussed with pt son via telephone that [facility] would not be able to provide transportation to treatments and pt son expressed understanding. CSW clarified pt son's questions regarding insurance coverage for SNF and private pay options for transportation. Pt son expressed concern about pt weakness and hopeful that it will be feasible for pt to go to [facility] in [place] once treatment plan is determined.   CSW spoke with Radiation oncologist Dr. Alfredo Bach RN, Manuela Schwartz and attending MD and pt will not yet be medically stable for discharge over weekend.   CSW updated pt son, Louie Casa and Architectural technologist.   CSW to continue to follow to provide support and assist with pt disposition needs.   Alison Murray, MSW, [place] [employer] [phone]

## 2014-12-02 NOTE — Progress Notes (Signed)
Patient ID: Cathy Meadows, female   DOB: 01/06/1946, 68 y.o.   MRN: [id number]  [facility] PROGRESS NOTE  Cathy Meadows FKC:[id number] DOB: 07-28-1946 DOA: 11/29/2014 PCP: Ernestene Kiel, MD   HPI:  Patient is 68 year old female with history of bipolar disorder, hypertension, hyperlipidemia, diabetes, for your history of smoking a pack per day, anxiety and depression. She was transferred from [facility] to [facility] for further evaluation of hypoxia. Patient explains that over the past week she felt weak and tired, had difficulty standing up and ambulating. Her son checked her oxygen level at home and said it was low less than 60%. He took her to the hospital and in the emergency department patient explains her oxygen saturation was 54% and a placed mask on her face. Patient denies chest pain at this time but has noted some exertional shortness of breath. She denies shortness of breath at rest. She also denies similar events in the past, no fevers or chills, no recent sicknesses or exposures. Patient denies any specific abdominal concerns except constipation which is chronic in nature. She also denies any specific urinary concerns and reports she has chronic increased urinary frequency. Patient has oxygen at home and explains she usually uses 1 L oxygen with ambulation and has been on it for past 6-8 months.  Per record review, patient has seen her primary care doctor one week prior to this admission for anxiety and persistent cough. In addition there is report of frequent falls at home but patient denied that to me. She has had chest x-ray as part of the workup which showed possible elevation of the right diaphragm. CT of the chest showed there was 10 x 8.9 x 12.2 cm right lower lobe mass. There also were bilateral paraspinal masses, however, these were unchanged compared to a CT from 2012. She was also noted to have enlarged pulmonary arteries suggestive of pulmonary  hypertension. In addition, patient had, PET CT done which showed the lung mass to be hypermetabolic. There was no evidence of regional or distant metastases. She did have an incidentally noted indeterminate parotid lesion.  Assessment and Plan: Active Problems: Principal Problem:  Acute respiratory failure with hypoxia - related to COPD and New lung ca as patient has a very long history of smoking - recent CT chest with right lower lobe mass, likely malignant  - IR consulted for CT guided biopsy, done 12/17, biopsy results pending  - Provide oxygen 2 L nasal cannula, provide antitussives if needed - per cardiothoracic surgery, pt most likely not candidate for lung resection - will need chemo eventually but we ned to first get biopsy results  - stop IVF due mild vascular congestion noted on CXR and physical exam  Active Problems:  Lung cancer  - reviewed Dr. 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 - s/p lung bx, with CT-guided core needle bx (12/17), appreciate IR assistance (12/17) - final recommendations for the brain treatment would be determined by tumor histology - d/w Dr. Julien Nordmann, plan for consultation once biopsy results back   Left cerebellar hemorrhagic mass - noted on recent MRA brain - reviewed per rad oncologist, plan for lung biopsy first and based on results further rec's to be provided   Nausea and vomiting - worrisome given brain mets, now resolved  - placed on Decadron as recommended by Rad Onc -  abd slightly distended, Ct abd and pelvis 12/17 with no specific acute finding to explain distension  - place on bowel regimen as moderate stool burden noted on CT abd  Leukocytosis - unclear etiology, possibly related the above problems and decadron use  - no clear signs of infectious etiology, WBC is  trending down, repeat CBC in AM  Diabetes mellitus without complication - B3Z 6.4 - Placed on sliding scale insulin for now  Hypertension - Blood pressure stable on admission, continue home medical regimen Norvasc and irbesartan   Bipolar disorder - Appears to be stable at this time  Hypercalcemia - provide IVF and repeat BMP in AM - Ca is trending down: 11.4 --> 10.9 --> 10.3  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 Enlargement of cardiac silhouette. No acute infiltrate.  Ct Abdomen Pelvis Wo Contrast  12/01/2014  1. Again noted large nodular mass in right lower lobe. Stable pleural thickening and atelectasis in right lower lobe posteriorly. 2. No acute inflammatory process within abdomen. 3. There is bilateral nephrolithiasis. No hydronephrosis or hydroureter. 4. Status postcholecystectomy. 5. Small lipoma within duodenum measures 1.5 cm. 6. Moderate stool noted in right colon and cecum. No pericecal inflammation. 7. Status post hysterectomy. 8. Degenerative changes lower thoracic and lumbar spine.    Dg Chest 1 View  12/01/2014   No pneumothorax following biopsy of RIGHT lung mass.  Enlargement of cardiac silhouette with slight pulmonary vascular congestion.   Ct Biopsy  12/01/2014  CT-guided core biopsies of the right lung mass.  Medical Consultants:    IR  Rad Onc Other Consultants:    None  Anti-Infectives:    None   Leisa Lenz, MD  [facility] Pager [phone]  If 7PM-7AM, please contact night-coverage [email or web address] Password [email or web address] 12/02/2014, 10:23 AM   LOS: 3 days    HPI/Subjective: No acute overnight events.  Objective: Filed Vitals:    12/01/14 2102 12/01/14 2156 12/02/14 0439 12/02/14 0818  BP:  124/64 127/73   Pulse:  72 75   Temp:  98.1 F (36.7 C) 98.4 F (36.9 C)   TempSrc:  Oral Oral   Resp:  20 20   Height:      Weight:   69.8 kg (153 lb 14.1 oz)   SpO2: 97% 100% 100% 100%    Intake/Output Summary (Last 24 hours) at 12/02/14 1023 Last data filed at 12/02/14 0850  Gross per 24 hour  Intake      0 ml  Output   1000 ml  Net  -1000 ml    Exam:   General:  Pt is alert, frail and weak, not in acute distress  Cardiovascular: Regular rate and rhythm, S1/S2, no murmurs  Respiratory: Clear to auscultation bilaterally, no wheezing, rhonchi scattered with diminished breath sounds at bases   Abdomen: Soft, non tender, non distended, bowel sounds present   Data Reviewed: Basic Metabolic Panel:  Recent Labs Lab 11/29/14 1312 11/30/14 0400 12/01/14 0437 12/02/14 0520  NA 137 135* 136* 135*  K 4.1 4.4 4.2 4.2  CL 98 98 97 98  CO2 25 28 26 25   GLUCOSE 126* 112* 196* 194*  BUN 20 19 17 20   CREATININE 0.65 0.63  0.59 0.53  CALCIUM 11.4* 10.9* 10.4 10.3  MG 1.8  --   --   --   PHOS 2.6  --   --   --    Liver Function Tests:  Recent Labs Lab 11/29/14 1312 12/02/14 0520  AST 21 13  ALT 19 14  ALKPHOS 120* 102  BILITOT <0.2* <0.2*  PROT 7.1 6.4  ALBUMIN 3.0* 2.5*   CBC:  Recent Labs Lab 11/29/14 1312 11/30/14 0400 12/01/14 0437 12/02/14 0520  WBC 15.0* 14.0* 13.0* 16.2*  HGB 12.0 11.5* 11.3* 11.2*  HCT 38.8 37.9 37.7 35.5*  MCV 84.9 85.4 86.3 84.1  PLT 467* 408* 373 340   CBG:  Recent Labs Lab 12/01/14 0750 12/01/14 1131 12/01/14 1751 12/01/14 2157 12/02/14 0820  GLUCAP 156* 126* 184* 222* 188*    No results found for this or any previous visit (from the past 240 hour(s)).   Scheduled Meds: . albuterol  2.5 mg Nebulization TID  . amLODipine  10 mg Oral Daily  . dexamethasone  6 mg Intravenous 4 times per day  . escitalopram  10 mg Oral BID  . insulin aspart  0-9 Units  Subcutaneous TID WC  . irbesartan  75 mg Oral Daily  . lamoTRIgine  150 mg Oral BID  . latanoprost  1 drop Right Eye QHS  . pravastatin  80 mg Oral QHS   Continuous Infusions: . sodium chloride 75 mL/hr at 11/30/14 2353

## 2014-12-03 LAB — BASIC METABOLIC PANEL
Anion gap: 12 (ref 5–15)
BUN: 23 mg/dL (ref 6–23)
CO2: 25 mEq/L (ref 19–32)
Calcium: 10.5 mg/dL (ref 8.4–10.5)
Chloride: 97 mEq/L (ref 96–112)
Creatinine, Ser: 0.57 mg/dL (ref 0.50–1.10)
GFR calc Af Amer: >90 mL/min (ref >90)
GLUCOSE: 193 mg/dL — AB (ref 70–99)
POTASSIUM: 4.3 meq/L (ref 3.7–5.3)
Sodium: 134 mEq/L — ABNORMAL LOW (ref 137–147)

## 2014-12-03 LAB — GLUCOSE, CAPILLARY
GLUCOSE-CAPILLARY: 217 mg/dL — AB (ref 70–99)
GLUCOSE-CAPILLARY: 196 mg/dL — AB (ref 70–99)
Glucose-Capillary: 168 mg/dL — ABNORMAL HIGH (ref 70–99)
Glucose-Capillary: 277 mg/dL — ABNORMAL HIGH (ref 70–99)

## 2014-12-03 LAB — CBC
HEMATOCRIT: 37.4 % (ref 36.0–46.0)
HEMOGLOBIN: 11.5 g/dL — AB (ref 12.0–15.0)
MCH: 26.1 pg (ref 26.0–34.0)
MCHC: 30.7 g/dL (ref 30.0–36.0)
MCV: 84.8 fL (ref 78.0–100.0)
Platelets: 350 10*3/uL (ref 150–400)
RBC: 4.41 MIL/uL (ref 3.87–5.11)
RDW: 17.8 % — ABNORMAL HIGH (ref 11.5–15.5)
WBC: 15 10*3/uL — ABNORMAL HIGH (ref 4.0–10.5)

## 2014-12-03 NOTE — Progress Notes (Signed)
Patient ID: Cathy Meadows, female   DOB: [birth year], 68 y.o.   MRN: [id number]  [facility] PROGRESS NOTE  Cathy Meadows:[id number] DOB: [birth year] DOA: 11/29/2014 PCP: Ernestene Kiel, MD   HPI:  Patient is 68 year old female with history of bipolar disorder, hypertension, hyperlipidemia, diabetes, for your history of smoking a pack per day, anxiety and depression. She was transferred from [facility] to [facility] for further evaluation of hypoxia. Patient explains that over the past week she felt weak and tired, had difficulty standing up and ambulating. Her son checked her oxygen level at home and said it was low less than 60%. He took her to the hospital and in the emergency department patient explains her oxygen saturation was 54% and a placed mask on her face. Patient denies chest pain at this time but has noted some exertional shortness of breath. She denies shortness of breath at rest. She also denies similar events in the past, no fevers or chills, no recent sicknesses or exposures. Patient denies any specific abdominal concerns except constipation which is chronic in nature. She also denies any specific urinary concerns and reports she has chronic increased urinary frequency. Patient has oxygen at home and explains she usually uses 1 L oxygen with ambulation and has been on it for past 6-8 months.  Per record review, patient has seen her primary care doctor one week prior to this admission for anxiety and persistent cough. In addition there is report of frequent falls at home but patient denied that to me. She has had chest x-ray as part of the workup which showed possible elevation of the right diaphragm. CT of the chest showed there was 10 x 8.9 x 12.2 cm right lower lobe mass. There also were bilateral paraspinal masses, however, these were unchanged compared to a CT from 2012. She was also noted to have enlarged pulmonary arteries suggestive of pulmonary  hypertension. In addition, patient had, PET CT done which showed the lung mass to be hypermetabolic. There was no evidence of regional or distant metastases. She did have an incidentally noted indeterminate parotid lesion.  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 Tammi Klippel if plan for inpatient radiation - pt is currently not open to chemo tx, this was discussed with her son as well  Active Problems:  Lung cancer  - reviewed Dr. 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 - reviewed per rad oncologist, will discuss with Dr. Tammi Klippel   Nausea and vomiting - worrisome given brain mets, now resolved  - placed on Decadron as recommended by Rad Onc - abd slightly distended, Ct abd and pelvis 12/17 with no specific acute finding to explain distension  - place on bowel regimen as moderate stool burden noted on CT abd  Leukocytosis - unclear etiology, possibly related the above problems and decadron use  -  no clear signs of infectious etiology, WBC is trending down, repeat CBC in AM  Diabetes mellitus without complication - J8A 6.4 - Placed on sliding scale insulin for now  Hypertension - Blood pressure stable on admission, continue home medical regimen Norvasc and irbesartan   Bipolar disorder - Appears to be stable at this time  Hypercalcemia - provide  IVF and repeat BMP in AM - Ca is trending down: 11.4 --> 10.9 --> 10.3 --> WNL   Severe PCM - in the context of acute illness - nutritionist consulted   DVT prophylaxis  SCD  Code Status: Full Family Communication: Pt at bedside Disposition Plan: SNF when medically ready    IV Access:    Peripheral IV Procedures and diagnostic studies:    Dg Chest 2 View 11/29/2014 Large RIGHT lower lobe mass. Enlargement of cardiac silhouette. No acute infiltrate.   Ct Abdomen Pelvis Wo Contrast 12/01/2014 1. Again noted large nodular mass in right lower lobe. Stable pleural thickening and atelectasis in right lower lobe posteriorly. 2. No acute inflammatory process within abdomen. 3. There is bilateral nephrolithiasis. No hydronephrosis or hydroureter. 4. Status postcholecystectomy. 5. Small lipoma within duodenum measures 1.5 cm. 6. Moderate stool noted in right colon and cecum. No pericecal inflammation. 7. Status post hysterectomy. 8. Degenerative changes lower thoracic and lumbar spine.   Dg Chest 1 View 12/01/2014 No pneumothorax following biopsy of RIGHT lung mass. Enlargement of cardiac silhouette with slight pulmonary vascular congestion.   Ct Biopsy 12/01/2014 CT-guided core biopsies of the right lung mass.  Medical Consultants:    IR  Rad Onc Other Consultants:    None  Anti-Infectives:    None  Faye Ramsay, MD  [facility] Pager [phone]  If 7PM-7AM, please contact night-coverage [email or web address] Password [email or web address] 12/03/2014, 11:59 AM   LOS: 4 days   HPI/Subjective: No events overnight.   Objective: Filed Vitals:   12/02/14 2113 12/02/14 2119 12/03/14 0542 12/03/14 0843  BP: 113/62  127/67   Pulse: 75  73   Temp: 98.1 F (36.7 C)  98 F (36.7 C)   TempSrc: Oral  Oral   Resp: 20  18   Height:      Weight:   69.355 kg (152 lb 14.4 oz)   SpO2: 100% 97% 100% 100%    Intake/Output Summary (Last 24 hours) at 12/03/14  1159 Last data filed at 12/03/14 0543  Gross per 24 hour  Intake    855 ml  Output    800 ml  Net     55 ml    Exam:   General:  Pt is alert, follows commands appropriately, not in acute distress  Cardiovascular: Regular rate and rhythm, S1/S2, no murmurs, no rubs, no gallops  Respiratory: Clear to auscultation bilaterally, no wheezing, mild rhonchi at bases   Abdomen: Soft, non tender, non distended, bowel sounds present, no guarding   Data Reviewed: Basic Metabolic Panel:  Recent Labs Lab 11/29/14 1312 11/30/14 0400 12/01/14 0437 12/02/14 0520 12/03/14 0456  NA 137 135* 136* 135* 134*  K 4.1 4.4 4.2 4.2 4.3  CL 98 98 97 98 97  CO2 25 28 26 25 25   GLUCOSE 126* 112* 196* 194* 193*  BUN 20 19 17 20 23   CREATININE 0.65 0.63 0.59 0.53 0.57  CALCIUM 11.4* 10.9* 10.4 10.3 10.5  MG 1.8  --   --   --   --   PHOS 2.6  --   --   --   --  Liver Function Tests:  Recent Labs Lab 11/29/14 1312 12/02/14 0520  AST 21 13  ALT 19 14  ALKPHOS 120* 102  BILITOT <0.2* <0.2*  PROT 7.1 6.4  ALBUMIN 3.0* 2.5*   CBC:  Recent Labs Lab 11/29/14 1312 11/30/14 0400 12/01/14 0437 12/02/14 0520 12/03/14 0456  WBC 15.0* 14.0* 13.0* 16.2* 15.0*  HGB 12.0 11.5* 11.3* 11.2* 11.5*  HCT 38.8 37.9 37.7 35.5* 37.4  MCV 84.9 85.4 86.3 84.1 84.8  PLT 467* 408* 373 340 350   CBG:  Recent Labs Lab 12/02/14 0820 12/02/14 1211 12/02/14 1700 12/02/14 2116 12/03/14 0729  GLUCAP 188* 253* 214* 233* 168*    Scheduled Meds: . albuterol  2.5 mg Nebulization TID  . amLODipine  10 mg Oral Daily  . dexamethasone  6 mg Intravenous 4 times per day  . escitalopram  10 mg Oral BID  . insulin aspart  0-9 Units Subcutaneous TID WC  . irbesartan  75 mg Oral Daily  . lamoTRIgine  150 mg Oral BID  . latanoprost  1 drop Right Eye QHS  . polyethylene glycol  17 g Oral BID  . pravastatin  80 mg Oral QHS  . senna-docusate  1 tablet Oral BID  . sodium chloride  3 mL Intravenous Q12H    Continuous Infusions:

## 2014-12-04 ENCOUNTER — Inpatient Hospital Stay (HOSPITAL_COMMUNITY): Payer: Medicare Other

## 2014-12-04 DIAGNOSIS — C7931 Secondary malignant neoplasm of brain: Secondary | ICD-10-CM | POA: Insufficient documentation

## 2014-12-04 LAB — GLUCOSE, CAPILLARY
GLUCOSE-CAPILLARY: 172 mg/dL — AB (ref 70–99)
GLUCOSE-CAPILLARY: 193 mg/dL — AB (ref 70–99)
Glucose-Capillary: 168 mg/dL — ABNORMAL HIGH (ref 70–99)
Glucose-Capillary: 139 mg/dL — ABNORMAL HIGH (ref 70–99)

## 2014-12-04 MED ORDER — MORPHINE SULFATE 2 MG/ML IJ SOLN
1.0000 mg | INTRAMUSCULAR | Status: DC | PRN
  Administered 2014-12-04 – 2014-12-05 (×2): 2 mg via INTRAVENOUS
  Filled 2014-12-04 (×2): qty 1

## 2014-12-04 MED ORDER — ONDANSETRON 8 MG PO TBDP
8.0000 mg | ORAL_TABLET | Freq: Three times a day (TID) | ORAL | Status: DC
  Administered 2014-12-04 (×2): 8 mg via ORAL
  Filled 2014-12-04 (×8): qty 1

## 2014-12-04 MED ORDER — PROMETHAZINE HCL 25 MG/ML IJ SOLN
12.5000 mg | INTRAMUSCULAR | Status: DC | PRN
Start: 2014-12-04 — End: 2014-12-06
  Administered 2014-12-04: 12.5 mg via INTRAVENOUS
  Filled 2014-12-04 (×2): qty 1

## 2014-12-04 MED ORDER — DEXAMETHASONE SODIUM PHOSPHATE 10 MG/ML IJ SOLN
4.0000 mg | Freq: Four times a day (QID) | INTRAMUSCULAR | Status: DC
  Administered 2014-12-04 – 2014-12-06 (×6): 4 mg via INTRAVENOUS
  Filled 2014-12-04: qty 0.4
  Filled 2014-12-04 (×3): qty 1
  Filled 2014-12-04 (×3): qty 0.4
  Filled 2014-12-04: qty 1
  Filled 2014-12-04 (×2): qty 0.4
  Filled 2014-12-04: qty 1
  Filled 2014-12-04 (×2): qty 0.4

## 2014-12-04 NOTE — Progress Notes (Signed)
Pt nauseated and vomiting all shift. Dressing changed to RT. Upper flank area. Old dressing fell off with old drainage on it. DSD reapplied. Son at bedside this afternoon. Abdominal x-ray done. Pt to go down to radiation 12/21 @0730  for simulation, they asked that she be medicated for nausea before they come and get her, will report that to the night nurse.

## 2014-12-04 NOTE — Plan of Care (Signed)
Problem: Phase I Progression Outcomes Goal: Pain controlled with appropriate interventions Outcome: Adequate for Discharge Pt c/o nausea/vomiting

## 2014-12-04 NOTE — Progress Notes (Signed)
Pt c/o headache 9/10. Only had order for oxy IR 5mg , so she tried to take it but immediately vomited the pill back up. Called provider and received order for morphine IV, which has been given along with IV phenergan. Will monitor for improvement in symptoms. Hortencia Conradi RN

## 2014-12-04 NOTE — Progress Notes (Signed)
Patient ID: Cathy Meadows, female   DOB: 12/01/1946, 68 y.o.   MRN: [id number]  [facility] PROGRESS NOTE  Cathy Meadows WUJ:[id number] DOB: [birth year] DOA: 11/29/2014 PCP: Ernestene Kiel, MD   HPI:  Patient is 68 year old female with history of bipolar disorder, hypertension, hyperlipidemia, diabetes, for your history of smoking a pack per day, anxiety and depression. She was transferred from [facility] to [facility] for further evaluation of hypoxia. Patient explains that over the past week she felt weak and tired, had difficulty standing up and ambulating. Her son checked her oxygen level at home and said it was low less than 60%. He took her to the hospital and in the emergency department patient explains her oxygen saturation was 54% and a placed mask on her face. Patient denies chest pain at this time but has noted some exertional shortness of breath. She denies shortness of breath at rest. She also denies similar events in the past, no fevers or chills, no recent sicknesses or exposures. Patient denies any specific abdominal concerns except constipation which is chronic in nature. She also denies any specific urinary concerns and reports she has chronic increased urinary frequency. Patient has oxygen at home and explains she usually uses 1 L oxygen with ambulation and has been on it for past 6-8 months.  Per record review, patient has seen her primary care doctor one week prior to this admission for anxiety and persistent cough. In addition there is report of frequent falls at home but patient denied that to me. She has had chest x-ray as part of the workup which showed possible elevation of the right diaphragm. CT of the chest showed there was 10 x 8.9 x 12.2 cm right lower lobe mass. There also were bilateral paraspinal masses, however, these were unchanged compared to a CT from 2012. She was also noted to have enlarged pulmonary arteries suggestive of pulmonary  hypertension. In addition, patient had, PET CT done which showed the lung mass to be hypermetabolic. There was no evidence of regional or distant metastases. She did have an incidentally noted indeterminate parotid lesion.  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 Tammi Klippel if plan for inpatient radiation - pt is currently not open to chemo tx, this was discussed with her son as well  Active Problems:  Lung cancer  - reviewed Dr. 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 - reviewed per rad oncologist, management per rad onc and onco team   Nausea and vomiting - worrisome given brain mets, better 12/17 - 12/19 but again started vomiting 12/20 - placed on Decadron as recommended by Rad Onc - Ct abd and pelvis 12/17 with no specific acute finding to explain distension but with moderate stool burden  - placed on bowel regimen and pt reports having BM last night and this AM  Leukocytosis -  possibly related the above problems and decadron use  - no clear signs of infectious etiology, WBC is trending down, repeat CBC in AM  Diabetes mellitus without complication - C0K 6.4 - Placed on sliding scale insulin for now  Hypertension - Blood pressure stable on admission, continue home medical regimen Norvasc and irbesartan   Bipolar disorder - Appears to be stable  at this time  Hypercalcemia - provide IVF and repeat BMP in AM - Ca is trending down: 11.4 --> 10.9 --> 10.3 --> WNL   Severe PCM - in the context of acute illness - nutritionist consulted   DVT prophylaxis  SCD  Code Status: Full Family Communication: Pt at bedside Disposition Plan: SNF when medically ready    IV Access:    Peripheral IV Procedures and diagnostic studies:    Dg Chest 2 View 11/29/2014 Large RIGHT lower lobe mass. Enlargement of cardiac silhouette. No acute infiltrate.   Ct Abdomen Pelvis Wo Contrast 12/01/2014 1. Again noted large nodular mass in right lower lobe. Stable pleural thickening and atelectasis in right lower lobe posteriorly. 2. No acute inflammatory process within abdomen. 3. There is bilateral nephrolithiasis. No hydronephrosis or hydroureter. 4. Status postcholecystectomy. 5. Small lipoma within duodenum measures 1.5 cm. 6. Moderate stool noted in right colon and cecum. No pericecal inflammation. 7. Status post hysterectomy. 8. Degenerative changes lower thoracic and lumbar spine.   Dg Chest 1 View 12/01/2014 No pneumothorax following biopsy of RIGHT lung mass. Enlargement of cardiac silhouette with slight pulmonary vascular congestion.   Ct Biopsy 12/01/2014 CT-guided core biopsies of the right lung mass.  Medical Consultants:    IR  Rad Onc Other Consultants:    None  Anti-Infectives:    None  Faye Ramsay, MD  [facility] Pager [phone]  If 7PM-7AM, please contact night-coverage [email or web address] Password [email or web address] 12/04/2014, 10:09 AM   LOS: 5 days   HPI/Subjective: More nausea and vomiting last night and still nausea this AM.   Objective: Filed Vitals:   12/03/14 1400 12/03/14 1443 12/03/14 2003 12/04/14 0539  BP: 122/62  135/67 137/72  Pulse: 78  77 75  Temp: 98.4 F (36.9 C)  98 F (36.7 C) 97.8 F (36.6 C)  TempSrc: Oral  Oral Oral  Resp: 18  20 16   Height:      Weight:       SpO2: 100% 100% 98% 99%    Intake/Output Summary (Last 24 hours) at 12/04/14 1009 Last data filed at 12/03/14 1800  Gross per 24 hour  Intake      0 ml  Output    650 ml  Net   -650 ml    Exam:   General:  Pt is sleeping but says she is very tired and weak   Cardiovascular: Regular rate and rhythm, no rubs, no gallops  Respiratory: Clear to auscultation bilaterally, no wheezing, diminished breath sounds at bases   Abdomen: Soft, non tender, non distended, bowel sounds present, no guarding  Extremities: No edema, pulses DP and PT palpable bilaterally  Data Reviewed: Basic Metabolic Panel:  Recent Labs Lab 11/29/14 1312 11/30/14 0400 12/01/14 0437 12/02/14 0520 12/03/14 0456  NA 137 135* 136* 135* 134*  K 4.1 4.4 4.2 4.2 4.3  CL 98 98 97 98 97  CO2 25 28 26 25 25   GLUCOSE 126* 112* 196* 194* 193*  BUN 20 19 17 20 23   CREATININE 0.65 0.63 0.59 0.53 0.57  CALCIUM 11.4* 10.9* 10.4 10.3 10.5  MG 1.8  --   --   --   --  PHOS 2.6  --   --   --   --    Liver Function Tests:  Recent Labs Lab 11/29/14 1312 12/02/14 0520  AST 21 13  ALT 19 14  ALKPHOS 120* 102  BILITOT <0.2* <0.2*  PROT 7.1 6.4  ALBUMIN 3.0* 2.5*   CBC:  Recent Labs Lab 11/29/14 1312 11/30/14 0400 12/01/14 0437 12/02/14 0520 12/03/14 0456  WBC 15.0* 14.0* 13.0* 16.2* 15.0*  HGB 12.0 11.5* 11.3* 11.2* 11.5*  HCT 38.8 37.9 37.7 35.5* 37.4  MCV 84.9 85.4 86.3 84.1 84.8  PLT 467* 408* 373 340 350   CBG:  Recent Labs Lab 12/03/14 0729 12/03/14 1213 12/03/14 1754 12/03/14 2141 12/04/14 0742  GLUCAP 168* 217* 196* 277* 172*    Scheduled Meds: . albuterol  2.5 mg Nebulization TID  . amLODipine  10 mg Oral Daily  . dexamethasone  6 mg Intravenous 4 times per day  . escitalopram  10 mg Oral BID  . insulin aspart  0-9 Units Subcutaneous TID WC  . irbesartan  75 mg Oral Daily  . lamoTRIgine  150 mg Oral BID  . latanoprost  1 drop Right Eye QHS  . polyethylene glycol  17 g  Oral BID  . pravastatin  80 mg Oral QHS  . senna-docusate  1 tablet Oral BID  . sodium chloride  3 mL Intravenous Q12H   Continuous Infusions:

## 2014-12-04 NOTE — Plan of Care (Signed)
Problem: Phase I Progression Outcomes Goal: Initial discharge plan identified Outcome: Progressing DC to [facility]

## 2014-12-04 NOTE — Plan of Care (Signed)
Problem: Phase I Progression Outcomes Goal: Voiding-avoid urinary catheter unless indicated Outcome: Completed/Met Date Met:  12/04/14 Using bedpan

## 2014-12-04 NOTE — Progress Notes (Signed)
Pt has c/o dizziness throughout night, but states it has been ongoing r/t her cerebellar mass. Pt vomited at 0115, IV zofran given at this time. Pt heard yelling for help at 0455, needed her emesis bag. Vomited again and stated she was dizzy. IV phenergan given as it was not time for zofran again. Will continue to monitor. Hortencia Conradi RN

## 2014-12-04 NOTE — Progress Notes (Signed)
  Radiation Oncology         [phone] ________________________________  Name: Cathy Meadows MRN: [id number]  Date: 11/29/2014  DOB: 1946/05/15  Chart Note:  Hospitalist care is greatly appreciated.  Recent pathology report reviewed with Dr. Ellene Route neurosurgery. Recommend that we cancel discharge to nursing home.  Here is the tentative plan at this time. The patient will undergo stereotactic radiosurgery planning tomorrow morning (12/21), then undergo preoperative stereotactic radiosurgery (12/22) and transfer to [facility] for tumor resection (12/22) later the same day.  Since her nausea and vomiting have not resolved with steroid treatment, it is likely, that her symptoms are the result of mass effect from the tumor and associated hemorrhage. Accordingly, she may not experience relief until her tumor is resected.  Following tumor resection, it is quite possible that the patient will be able to ambulate and not have the same challenges with nausea and vomiting. This may help her to function independently following her eventual hospital discharge.  ________________________________  Sheral Apley. Tammi Klippel, M.D.

## 2014-12-04 NOTE — Plan of Care (Signed)
Problem: Phase I Progression Outcomes Goal: Initial discharge plan identified Outcome: Progressing Discussing [facility]

## 2014-12-04 NOTE — Progress Notes (Addendum)
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 Solitary cerebellar brain metastasis 198.3 C79.31   2. Primary cancer of right lower lobe of lung 162.5 C34.31     HISTORY OF PRESENT ILLNESS::Cathy Meadows is a 68 y.o. female who presented to her primary care physician with increasing shortness of breath and cough. Chest x-ray on 10/13/2014 demonstrated what appeared to be prominent elevation of the right hemidiaphragm prompting recommendation for chest CT.    Subsequent chest CT on 10/28/2014 revealed a 12 cm smoothly marginated mass involving portions of the right upper lobe and lower lobe of the lung consistent with a primary lung neoplasm.    Subsequent PET/CT on 11/15/2014 showed the right lung mass to have an elevated SUV of 16 consistent with cancer with no evident metastatic involvement of lymph nodes or other organs.    During the process of her workup, the patient developed headache, dizziness, and difficulty walking with falls. Head CT without contrast on 11/24/2014 demonstrated a probable mass in the left cerebellum.    Brain MRI on 11/25/2014 showed a hemorrhagic mass involving the left superior cerebellum measuring 1532 mm with moderate cerebellar edema.    The patient was seen in consultation by thoracic surgery and subsequently, her brain MRI has been presented in our multidisciplinary brain tumor conference for discussion of potential options. The patient is scheduled for CT-guided biopsy of the lung for further evaluation.  PREVIOUS RADIATION THERAPY: No  PAST  MEDICAL HISTORY:  has a past medical history of Diabetes mellitus without complication; Hypertension; Renal disorder; Cancer; Bipolar disorder; Osteoarthritis; Fibromyalgia; Glaucoma; Hyperlipidemia; Asthma; Alcohol abuse; Tobacco abuse; and Right lower lobe lung mass.    PAST SURGICAL HISTORY: Past Surgical History  Procedure Laterality Date  . Breast surgery right    . Parotidectomy    . Gallbladder surgery    . Abdominal hysterectomy      complete    FAMILY HISTORY: family history includes Breast cancer in her mother; Cancer in her father and mother; Heart disease in her father and mother; Hypertension in her father and mother; Stroke in her father and mother.  SOCIAL HISTORY:  reports that she has been smoking Cigarettes.  She has a 80 pack-year smoking history. She does not have any smokeless tobacco history on file. She reports that she does not drink alcohol or use illicit drugs.  ALLERGIES: Codeine; Penicillins; Robaxin; Sulfa antibiotics; and Tizanidine  MEDICATIONS:  No current facility-administered medications for this encounter.   No current outpatient prescriptions on file.   Facility-Administered Medications Ordered in Other Encounters  Medication Dose Route Frequency Provider Last Rate Last Dose  . 0.9 %  sodium chloride infusion  250 mL Intravenous PRN Theodis Blaze, MD      . albuterol (PROVENTIL) (2.5 MG/3ML) 0.083% nebulizer solution 2.5 mg  2.5 mg Nebulization Q2H PRN Theodis Blaze, MD      . albuterol (PROVENTIL) (2.5 MG/3ML) 0.083% nebulizer solution 2.5 mg  2.5 mg Nebulization TID Theodis Blaze, MD   2.5 mg at 12/03/14 1947  . amLODipine (NORVASC) tablet 10 mg  10 mg Oral Daily  Theodis Blaze, MD   10 mg at 12/04/14 1307  . cyclobenzaprine (FLEXERIL) tablet 10 mg  10 mg Oral TID PRN Theodis Blaze, MD      . dexamethasone (DECADRON) injection 4 mg  4 mg Intravenous 4 times per day Lora Paula, MD      . escitalopram (LEXAPRO) tablet 10 mg  10 mg Oral BID  Theodis Blaze, MD   10 mg at 12/03/14 2135  . insulin aspart (novoLOG) injection 0-9 Units  0-9 Units Subcutaneous TID WC Theodis Blaze, MD   2 Units at 12/04/14 1259  . irbesartan (AVAPRO) tablet 75 mg  75 mg Oral Daily Theodis Blaze, MD   75 mg at 12/03/14 1002  . lamoTRIgine (LAMICTAL) tablet 150 mg  150 mg Oral BID Theodis Blaze, MD   150 mg at 12/03/14 2135  . latanoprost (XALATAN) 0.005 % ophthalmic solution 1 drop  1 drop Right Eye QHS Theodis Blaze, MD   1 drop at 12/03/14 2135  . LORazepam (ATIVAN) tablet 1 mg  1 mg Oral BID PRN Theodis Blaze, MD   1 mg at 12/04/14 1422  . ondansetron (ZOFRAN) tablet 4 mg  4 mg Oral Q6H PRN Theodis Blaze, MD       Or  . ondansetron [facility]) injection 4 mg  4 mg Intravenous Q6H PRN Theodis Blaze, MD   4 mg at 12/04/14 2595  . ondansetron (ZOFRAN-ODT) disintegrating tablet 8 mg  8 mg Oral 3 times per day Theodis Blaze, MD   8 mg at 12/04/14 1626  . oxyCODONE (Oxy IR/ROXICODONE) immediate release tablet 5 mg  5 mg Oral Q4H PRN Theodis Blaze, MD   5 mg at 11/30/14 1949  . polyethylene glycol (MIRALAX / GLYCOLAX) packet 17 g  17 g Oral BID Robbie Lis, MD   17 g at 12/02/14 1432  . promethazine (PHENERGAN) injection 12.5 mg  12.5 mg Intravenous Q4H PRN Theodis Blaze, MD      . senna-docusate (Senokot-S) tablet 1 tablet  1 tablet Oral BID Robbie Lis, MD   1 tablet at 12/03/14 1002  . sodium chloride 0.9 % injection 3 mL  3 mL Intravenous Q12H Theodis Blaze, MD   3 mL at 12/04/14 1000  . sodium chloride 0.9 % injection 3 mL  3 mL Intravenous PRN Theodis Blaze, MD        REVIEW OF SYSTEMS:  A 15 point review of systems is documented in the electronic medical record. This was obtained by the nursing staff. However, I reviewed this with the patient to discuss relevant findings and make appropriate changes.  Pertinent items are noted in HPI.   PHYSICAL EXAM: Per hospitalist, Constitutional: Appears well-developed and well-nourished. No distress.  HENT:  Normocephalic. External right and left ear normal. Oropharynx is clear and moist.  Eyes: Conjunctivae and EOM are normal. PERRLA, no scleral icterus.  Neck: Normal ROM. Neck supple. No JVD. No tracheal deviation. No thyromegaly.  CVS: RRR, S1/S2 +, no murmurs, no gallops, no carotid bruit.  Pulmonary: Effort and breath sounds normal, no stridor, rhonchi, wheezes, rales.  Abdominal: Soft. BS +, no distension, tenderness, rebound or guarding.  Musculoskeletal: Normal range of motion. No edema and no tenderness.  Lymphadenopathy: No lymphadenopathy noted, cervical, inguinal. Neuro: Alert. Normal reflexes, muscle tone coordination. No cranial nerve deficit. Skin: Skin is warm and dry. No rash noted. Not diaphoretic. No erythema. No pallor.  Psychiatric: Normal mood and affect. Behavior, judgment, thought content normal.   KPS = 50  100 - Normal; no complaints; no evidence of disease. 90   - Able to carry on normal activity; minor signs or symptoms of disease. 80   - Normal activity with effort; some signs or symptoms of disease. 63   - Cares for self; unable to carry on normal activity or to do active work. 60   - Requires occasional assistance, but is able to care for most of his personal needs. 50   - Requires considerable assistance and frequent medical care. 76   - Disabled; requires special care and assistance. 64   - Severely disabled; hospital admission is indicated although death not imminent. 61   - Very sick; hospital admission necessary; active supportive treatment necessary. 10   - Moribund; fatal processes progressing rapidly. 0     - Dead  Karnofsky DA, Abelmann Belle, Craver LS and Huckabay JH [id number]) The use of the nitrogen mustards in the palliative treatment of carcinoma: with particular reference to bronchogenic carcinoma Cancer 1 634-56  LABORATORY DATA:  Lab Results  Component Value Date   WBC 15.0* 12/03/2014   HGB 11.5* 12/03/2014   HCT 37.4 12/03/2014   MCV 84.8  12/03/2014   PLT 350 12/03/2014   Lab Results  Component Value Date   NA 134* 12/03/2014   K 4.3 12/03/2014   CL 97 12/03/2014   CO2 25 12/03/2014   Lab Results  Component Value Date   ALT 14 12/02/2014   AST 13 12/02/2014   ALKPHOS 102 12/02/2014   BILITOT <0.2* 12/02/2014     RADIOGRAPHY: Ct Abdomen Pelvis Wo Contrast  12/01/2014   CLINICAL DATA:  Acute abdominal distension, right upper quadrant pain  EXAM: CT ABDOMEN AND PELVIS WITHOUT CONTRAST  TECHNIQUE: Multidetector CT imaging of the abdomen and pelvis was performed following the standard protocol without IV contrast.  COMPARISON:  CT scan of the chest same day end PET scan 11/15/2014  FINDINGS: Persistent large nodular consolidation right lower lobe. Small atelectasis and pleural thickening right base posteriorly is stable.  The patient is status postcholecystectomy.  No pleural effusion is noted bilaterally.  Unenhanced liver shows no biliary ductal dilatation. Partial fatty replacement of the pancreas. Unenhanced spleen and adrenal glands are unremarkable. Unenhanced kidneys are symmetrical in size. Atherosclerotic calcifications bilaterally artery origin. Nonobstructive calcified calculus lower pole of the right kidney measures 2.3 mm. Tiny nonobstructive calculi midpole of the right kidney the largest measures 2 mm. Nonobstructive calculus upper pole of the left kidney measures 2 mm.  No hydronephrosis or hydroureter. No calcified ureteral calculi are noted bilaterally.  Moderate stool noted in right colon and proximal transverse colon. There is a probable lipoma within duodenum measures 1.5 cm.  No small bowel obstruction. No ascites or free air. No adenopathy. Abundant stool noted within cecum. No pericecal inflammation. The terminal ileum is unremarkable. The urinary bladder is unremarkable.  The patient is status post hysterectomy.  There is mild dextroscoliosis of lumbar spine.  Sagittal images of the spine shows osteopenia and  degenerative changes lower thoracic and lumbar spine.  IMPRESSION: 1. Again noted large nodular mass in right lower lobe. Stable pleural thickening and atelectasis in right lower lobe posteriorly. 2. No acute inflammatory process within abdomen. 3. There is bilateral nephrolithiasis. No hydronephrosis or hydroureter. 4. Status postcholecystectomy. 5. Small lipoma within duodenum measures 1.5 cm. 6. Moderate stool noted in right colon and cecum. No pericecal inflammation.  7. Status post hysterectomy. 8. Degenerative changes lower thoracic and lumbar spine.   Electronically Signed   By: Lahoma Crocker M.D.   On: 12/01/2014 16:34   Dg Chest 1 View  12/01/2014   CLINICAL DATA:  Post RIGHT lung biopsy  EXAM: CHEST - 1 VIEW  COMPARISON:  CT chest 12/01/2014, chest radiographs 11/29/2014  FINDINGS: Enlargement of cardiac silhouette with slight vascular congestion.  Atherosclerotic calcification aorta.  Mediastinal contours otherwise normal.  Again identified large mass at RIGHT lung base as noted on prior chest radiographs and CT.  No pneumothorax post lung biopsy.  Remaining lungs clear.  Osseous structures unremarkable.  IMPRESSION: No pneumothorax following biopsy of RIGHT lung mass.  Enlargement of cardiac silhouette with slight pulmonary vascular congestion.   Electronically Signed   By: Lavonia Dana M.D.   On: 12/01/2014 16:36   Dg Chest 2 View  11/29/2014   CLINICAL DATA:  Dyspnea, weakness, hypertension, diabetes  EXAM: CHEST  2 VIEW  COMPARISON:  The 11/24/2014  FINDINGS: Enlargement of cardiac silhouette.  Mediastinal contours stable.  Calcified mildly tortuous thoracic aorta.  Again identified large opacity at RIGHT lung base corresponding to large known RIGHT lower lobe mass on CT measure 11.0 x 14.0 x 15.2 cm  Remaining lungs clear.  No pleural effusion or pneumothorax.  No acute osseous findings.  IMPRESSION: Large RIGHT lower lobe mass.  Enlargement of cardiac silhouette.  No acute infiltrate.    Electronically Signed   By: Lavonia Dana M.D.   On: 11/29/2014 15:06   Ct Biopsy  12/01/2014   CLINICAL DATA:  68 year old with a large right lung mass and brain lesions. Tissue diagnosis is needed.  EXAM: CT GUIDED RIGHT LUNG MASS BIOPSY  Physician: Stephan Minister. Anselm Pancoast, MD  MEDICATIONS: 1 mg versed, 25 mcg fentanyl. A radiology nurse monitored the patient for moderate sedation.  ANESTHESIA/SEDATION: Sedation time: 13 min  PROCEDURE: The procedure was explained to the patient. The risks and benefits of the procedure were discussed and the patient's questions were addressed. Informed consent was obtained from the patient. The patient was placed supine on the CT scanner. Images through the chest were obtained. The right lower chest was marked. The right lower chest was prepped and draped in sterile fashion. 1% lidocaine was used for local anesthetic. A 17 gauge needle was directed into the right lung mass from a right intercostal approach. Three core biopsies were obtained with an 18 gauge core device. Specimens were placed in formalin. 17 gauge needle was removed without complication. Bandage placed at the puncture site.  FINDINGS: Large mass occupying the right lower lobe. Needle position confirmed within the lesion. No evidence for pneumothorax following the core biopsies.  COMPLICATIONS: None  IMPRESSION: CT-guided core biopsies of the right lung mass.   Electronically Signed   By: Markus Daft M.D.   On: 12/01/2014 15:55   Dg Abd 2 Views  12/04/2014   CLINICAL DATA:  Nausea, vomiting today  EXAM: ABDOMEN - 2 VIEW  COMPARISON:  CT abdomen 12/01/2014  FINDINGS: The bowel gas pattern is normal. There is no evidence of free air. No radio-opaque calculi or other significant radiographic abnormality is seen.  IMPRESSION: Negative.   Electronically Signed   By: Kathreen Devoid   On: 12/04/2014 13:46      IMPRESSION: This patient is a very nice 68 year old woman with presumed solitary cerebellar brain metastasis from  primary right-sided lung cancer, pending tissue diagnosis. Possible histologies include small cell cancer as  well as non-small cell cancer, which are managed quite differently. In the event that this is a non-small cell carcinoma, surgical resection of the cerebellar brain metastasis would be recommended for consideration using radiotherapy either preoperatively or postoperatively to reduce the risk of recurrence at the resection site.  PLAN:Today, I talked to the patient and family about the findings and work-up thus far.  We discussed the natural history of brain metastasis from lung cancer and general treatment, highlighting the role of radiotherapy in the management.  We discussed the available radiation techniques, and focused on the details of logistics and delivery.  We reviewed the anticipated acute and late sequelae associated with radiation in this setting.  The patient was encouraged to ask questions that I answered to the best of my ability.    We will await the results of her upcoming CT-guided lung biopsy and provide additional recommendations.  I spent 60 minutes minutes face to face with the patient and more than 50% of that time was spent in counseling and/or coordination of care.    ------------------------------------------------  Sheral Apley. Tammi Klippel, M.D.    Addendum, 12/04/2014: The patient CT-guided lung biopsy was performed uneventfully on 12/01/2014. The biopsy showed non-small cell carcinoma, consistent with primary squamous cell carcinoma of the lung. Based on this result, I would recommend proceeding with preoperative stereotactic radiosurgery as needed in patients to be followed by suboccipital craniectomy with en bloc resection of the cerebellar metastasis 24-48 hours following treatment.

## 2014-12-05 ENCOUNTER — Inpatient Hospital Stay (HOSPITAL_COMMUNITY): Payer: Medicare Other

## 2014-12-05 ENCOUNTER — Encounter (HOSPITAL_COMMUNITY)
Admission: AD | Disposition: A | Discharge status: Home / Self Care | Payer: Self-pay | Source: Other Acute Inpatient Hospital | Attending: Internal Medicine

## 2014-12-05 ENCOUNTER — Ambulatory Visit: Payer: Medicare Other | Admitting: Radiation Oncology

## 2014-12-05 DIAGNOSIS — C7931 Secondary malignant neoplasm of brain: Secondary | ICD-10-CM

## 2014-12-05 DIAGNOSIS — R0600 Dyspnea, unspecified: Secondary | ICD-10-CM

## 2014-12-05 DIAGNOSIS — R52 Pain, unspecified: Secondary | ICD-10-CM

## 2014-12-05 DIAGNOSIS — Z66 Do not resuscitate: Secondary | ICD-10-CM

## 2014-12-05 LAB — GLUCOSE, CAPILLARY
GLUCOSE-CAPILLARY: 148 mg/dL — AB (ref 70–99)
Glucose-Capillary: 147 mg/dL — ABNORMAL HIGH (ref 70–99)
Glucose-Capillary: 169 mg/dL — ABNORMAL HIGH (ref 70–99)
Glucose-Capillary: 152 mg/dL — ABNORMAL HIGH (ref 70–99)

## 2014-12-05 LAB — BASIC METABOLIC PANEL
Anion gap: 12 (ref 5–15)
BUN: 28 mg/dL — AB (ref 6–23)
CALCIUM: 11.2 mg/dL — AB (ref 8.4–10.5)
CO2: 28 mEq/L (ref 19–32)
CREATININE: 0.61 mg/dL (ref 0.50–1.10)
Chloride: 98 mEq/L (ref 96–112)
GFR calc Af Amer: >90 mL/min (ref >90)
GLUCOSE: 186 mg/dL — AB (ref 70–99)
Potassium: 4.4 mEq/L (ref 3.7–5.3)
SODIUM: 138 meq/L (ref 137–147)

## 2014-12-05 LAB — CBC
HCT: 40.5 % (ref 36.0–46.0)
Hemoglobin: 12.6 g/dL (ref 12.0–15.0)
MCH: 26.4 pg (ref 26.0–34.0)
MCHC: 31.1 g/dL (ref 30.0–36.0)
MCV: 84.9 fL (ref 78.0–100.0)
Platelets: 385 10*3/uL (ref 150–400)
RBC: 4.77 MIL/uL (ref 3.87–5.11)
RDW: 17.8 % — ABNORMAL HIGH (ref 11.5–15.5)
WBC: 12.1 10*3/uL — ABNORMAL HIGH (ref 4.0–10.5)

## 2014-12-05 LAB — MRSA PCR SCREENING: MRSA BY PCR: NEGATIVE

## 2014-12-05 SURGERY — CRANIOTOMY TUMOR EXCISION
Anesthesia: General

## 2014-12-05 MED ORDER — DEXAMETHASONE SODIUM PHOSPHATE 10 MG/ML IJ SOLN
10.0000 mg | Freq: Once | INTRAMUSCULAR | Status: AC
  Administered 2014-12-05: 10 mg via INTRAVENOUS
  Filled 2014-12-05: qty 1

## 2014-12-05 MED ORDER — CETYLPYRIDINIUM CHLORIDE 0.05 % MT LIQD
7.0000 mL | Freq: Two times a day (BID) | OROMUCOSAL | Status: DC
  Administered 2014-12-05 (×2): 7 mL via OROMUCOSAL

## 2014-12-05 MED ORDER — SODIUM CHLORIDE 0.9 % IV SOLN: 250.0000 mL | INTRAVENOUS | Status: DC | PRN

## 2014-12-05 MED ORDER — SODIUM CHLORIDE 0.9 % IV SOLN
250.0000 mL | INTRAVENOUS | Status: DC | PRN
  Administered 2014-12-05: 1000 mL via INTRAVENOUS

## 2014-12-05 MED ORDER — WHITE PETROLATUM GEL
Status: AC
  Filled 2014-12-05: qty 5

## 2014-12-05 MED ORDER — LORAZEPAM 2 MG/ML IJ SOLN
1.0000 mg | INTRAMUSCULAR | Status: DC | PRN
  Administered 2014-12-05 – 2014-12-06 (×2): 1 mg via INTRAVENOUS
  Filled 2014-12-05 (×2): qty 1

## 2014-12-05 MED ORDER — MORPHINE SULFATE 2 MG/ML IJ SOLN
1.0000 mg | INTRAMUSCULAR | Status: DC | PRN
  Administered 2014-12-05 – 2014-12-06 (×6): 1 mg via INTRAVENOUS
  Filled 2014-12-05 (×6): qty 1

## 2014-12-05 MED ORDER — NALOXONE HCL 0.4 MG/ML IJ SOLN: 0.4000 mg | INTRAMUSCULAR | Status: DC | PRN

## 2014-12-05 MED ORDER — BISACODYL 10 MG RE SUPP: 10.0000 mg | Freq: Every day | RECTAL | Status: DC | PRN

## 2014-12-05 NOTE — Clinical Social Work Note (Signed)
Clinical Social Worker continuing to follow patient and family for support and discharge planning needs.  Patient was transferred from [facility] to [facility] on 12/21 for lung mets to the brain for possible surgery.  Patient and family have opted to not have surgery and pursue full comfort measures.  CSW spoke with patient son at bedside and discussed patient wishes in detail.  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  Patient son feels comfort in the idea of decision making upon patient wishes but does express concern and fear about family thoughts.  Patient son states that there are 3 children and all are very involved and in agreement with patient wishes.  Patient son is appropriately emotional but engaged in conversation and realistic about situation.  Patient son agreeable to meet with Palliative medicine to establish discharge disposition.  CSW spoke with Palliative NP who states that patient is eligible for residential hospice with preference to [facility]  [facility] RN to come and assess patient at bedside for possible admission tomorrow.  CSW to updated treatment team and patient family once notified.  CSW remains available for support and to facilitate patient discharge needs.  Barbette Or, [place]

## 2014-12-05 NOTE — Discharge Summary (Signed)
Physician Discharge Summary  Patient ID: Cathy Meadows MRN: [id number] DOB/AGE: Sep 02, 1946 68 y.o.  Admit date: 11/29/2014 Discharge date: 12/05/2014  Admission Diagnoses:Acute respiratory failure, lung CA  Discharge Diagnoses:  Principal Problem:   Acute respiratory failure with hypoxia Active Problems:   Diabetes mellitus without complication   Hypertension   Bipolar disorder   Hyperlipidemia   Hypoxia   Lung mass   Protein-calorie malnutrition, severe   DNR (do not resuscitate)   Dyspnea   Pain, generalized   Discharged Condition: poor  Hospital Course: Mrs. Mccasland was admitted to the hospital with respiratory failure. During this hospitalization she underwent a lung biopsy which revealed squamous cell CA. She was scheduled for preoperative radiation and subsequent tumor resection. This AM nursing reported that she had a neurologic decline. Head CT revealed that the tumor had hemorrhaged again and was larger. She was transferred to [facility] in anticipation of a craniotomy. After a long discussion with her son, Mrs. Holsinger has decided against surgery, and would like hospice care. She will be discharged.   Consults: hospice  Significant Diagnostic Studies: as above  Treatments: surgery: lung biopsy  Discharge Exam: Blood pressure 144/76, pulse 82, temperature 97.2 F (36.2 C), temperature source Oral, resp. rate 17, height 5\' 2"  (1.575 m), weight 67.6 kg (149 lb 0.5 oz), SpO2 93 %. alert, folllowing commands, moving all extremities.left side dysmetria, left dysdiadokinesis, dysarthria, moving all extremites, strength is ~5/5 all extremities.   Disposition: 01-Home or Self Care     Medication List    ASK your doctor about these medications        albuterol (2.5 MG/3ML) 0.083% nebulizer solution  Commonly known as:  PROVENTIL  Take 2.5 mg by nebulization every 6 (six) hours as needed for wheezing or shortness of breath.     amLODipine 10 MG tablet  Commonly  known as:  NORVASC  Take 10 mg by mouth daily.     ammonium lactate 12 % lotion  Commonly known as:  LAC-HYDRIN  Apply 1 application topically 2 (two) times daily. To the affected areas     brimonidine 0.1 % Soln  Commonly known as:  ALPHAGAN P  Place 1 drop into the right eye 2 (two) times daily.     cholecalciferol 1000 UNITS tablet  Commonly known as:  VITAMIN D  Take 1,000 Units by mouth daily.     cyclobenzaprine 10 MG tablet  Commonly known as:  FLEXERIL  Take 10 mg by mouth 3 (three) times daily as needed for muscle spasms.     escitalopram 10 MG tablet  Commonly known as:  LEXAPRO  Take 10 mg by mouth 2 (two) times daily.     fish oil-omega-3 fatty acids 1000 MG capsule  Take 1 g by mouth 2 (two) times daily.     furosemide 20 MG tablet  Commonly known as:  LASIX  Take 20 mg by mouth daily as needed for fluid or edema.     lamoTRIgine 150 MG tablet  Commonly known as:  LAMICTAL  Take 150 mg by mouth 2 (two) times daily.     latanoprost 0.005 % ophthalmic solution  Commonly known as:  XALATAN  Place 1 drop into the right eye at bedtime.     LORazepam 1 MG tablet  Commonly known as:  ATIVAN  Take 1 mg by mouth 2 (two) times daily as needed for anxiety.     metFORMIN 500 MG 24 hr tablet  Commonly known as:  GLUCOPHAGE-XR  Take 500 mg by mouth daily with breakfast.     pioglitazone 15 MG tablet  Commonly known as:  ACTOS  Take 15 mg by mouth daily.     pravastatin 80 MG tablet  Commonly known as:  PRAVACHOL  Take 80 mg by mouth at bedtime.     valsartan 320 MG tablet  Commonly known as:  DIOVAN  Take 320 mg by mouth at bedtime.     vitamin B-12 1000 MCG tablet  Commonly known as:  CYANOCOBALAMIN  Take 1,000 mcg by mouth daily.         Signed: Terrace Fontanilla L 12/05/2014, 6:37 PM

## 2014-12-05 NOTE — Progress Notes (Signed)
MRI showing increased hemorrhage in the brain, previously 3.2 x 1.5cm now 4.5 x 3.3 cm. NP made aware and in to see pt. Will be contacting neurosurgery. Continue to monitor. Hortencia Conradi RN

## 2014-12-05 NOTE — Consult Note (Signed)
Reason for Consult: Cerebellar metastasis Referring Physician: Dr. Tyler Pita  Cathy Meadows is an 68 y.o. female.  HPI: Ms. Sunderlin is a 68 year old individual who was diagnosed with a lung mass about a month ago. She had developed significant headache and instability on her feet. 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 It was planned that the patient should undergo biopsy of the lung mass for diagnosis and appropriate treatment either radiation or surgery for the metastasis to the brain. It was planned that the patient was to have preoperative stereotactic radiosurgery for the cerebellar mass as the diagnosis was squamous cell carcinoma, followed by surgical resection scheduled for tomorrow.  Early this morning the patient had deterioration in her level of function at [facility] and a CT scan demonstrated the presence of hemorrhage within the cerebellar tumor. She was transferred to [facility] emergently. Her son is present currently and after significant discussions of her findings on the CT scan he noted that her desire was that if something significant happened that she would not want to pursue further aggressive treatment. He feels that she is lucid in her desire to simply go home that she would not want aggressive care in light of the significant change that has occurred.  Past Medical History  Diagnosis Date  . Diabetes mellitus without complication   . Hypertension   . Renal disorder   . Cancer   . Bipolar disorder   . Osteoarthritis   . Fibromyalgia   . Glaucoma     right eye  . Hyperlipidemia   . Asthma   . Alcohol abuse     quit 2007 per patient  . Tobacco abuse   . Right lower lobe lung mass     1st noted 09/2014    Past Surgical History  Procedure Laterality Date  . Breast surgery right    . Parotidectomy    . Gallbladder surgery    . Abdominal hysterectomy       complete    Family History  Problem Relation Age of Onset  . Hypertension Father   . Cancer Father     skin  . Stroke Father   . Heart disease Father   . Hypertension Mother   . Heart disease Mother   . Breast cancer Mother   . Cancer Mother     skin  . Stroke Mother     Social History:  reports that she has been smoking Cigarettes.  She has a 80 pack-year smoking history. She does not have any smokeless tobacco history on file. She reports that she does not drink alcohol or use illicit drugs.  Allergies:  Allergies  Allergen Reactions  . Codeine Rash  . Penicillins Rash  . Robaxin [Methocarbamol] Rash  . Sulfa Antibiotics Rash  . Tizanidine Rash    Medications: I have reviewed the patient's current medications.  Results for orders placed or performed during the hospital encounter of 11/29/14 (from the past 48 hour(s))  Glucose, capillary     Status: Abnormal   Collection Time: 12/03/14 12:13 PM  Result Value Ref Range   Glucose-Capillary 217 (H) 70 - 99 mg/dL   Comment 1 Documented in Chart    Comment 2 Notify RN   Glucose, capillary     Status: Abnormal   Collection Time: 12/03/14  5:54 PM  Result Value Ref Range   Glucose-Capillary 196 (H) 70 - 99 mg/dL  Comment 1 Notify RN   Glucose, capillary     Status: Abnormal   Collection Time: 12/03/14  9:41 PM  Result Value Ref Range   Glucose-Capillary 277 (H) 70 - 99 mg/dL   Comment 1 Notify RN   Glucose, capillary     Status: Abnormal   Collection Time: 12/04/14  7:42 AM  Result Value Ref Range   Glucose-Capillary 172 (H) 70 - 99 mg/dL  Glucose, capillary     Status: Abnormal   Collection Time: 12/04/14 11:42 AM  Result Value Ref Range   Glucose-Capillary 168 (H) 70 - 99 mg/dL  Glucose, capillary     Status: Abnormal   Collection Time: 12/04/14  5:05 PM  Result Value Ref Range   Glucose-Capillary 193 (H) 70 - 99 mg/dL  Glucose, capillary     Status: Abnormal   Collection Time: 12/04/14  9:36 PM  Result  Value Ref Range   Glucose-Capillary 139 (H) 70 - 99 mg/dL   Comment 1 Notify RN   Glucose, capillary     Status: Abnormal   Collection Time: 12/05/14 12:25 AM  Result Value Ref Range   Glucose-Capillary 147 (H) 70 - 99 mg/dL  CBC     Status: Abnormal   Collection Time: 12/05/14  4:01 AM  Result Value Ref Range   WBC 12.1 (H) 4.0 - 10.5 K/uL   RBC 4.77 3.87 - 5.11 MIL/uL   Hemoglobin 12.6 12.0 - 15.0 g/dL   HCT 40.5 36.0 - 46.0 %   MCV 84.9 78.0 - 100.0 fL   MCH 26.4 26.0 - 34.0 pg   MCHC 31.1 30.0 - 36.0 g/dL   RDW 17.8 (H) 11.5 - 15.5 %   Platelets 385 150 - 400 K/uL  Basic metabolic panel     Status: Abnormal   Collection Time: 12/05/14  4:01 AM  Result Value Ref Range   Sodium 138 137 - 147 mEq/L   Potassium 4.4 3.7 - 5.3 mEq/L   Chloride 98 96 - 112 mEq/L   CO2 28 19 - 32 mEq/L   Glucose, Bld 186 (H) 70 - 99 mg/dL   BUN 28 (H) 6 - 23 mg/dL   Creatinine, Ser 0.61 0.50 - 1.10 mg/dL   Calcium 11.2 (H) 8.4 - 10.5 mg/dL   GFR calc non Af Amer >90 >90 mL/min   GFR calc Af Amer >90 >90 mL/min    Comment: (NOTE) The eGFR has been calculated using the CKD EPI equation. This calculation has not been validated in all clinical situations. eGFR's persistently <90 mL/min signify possible Chronic Kidney Disease.    Anion gap 12 5 - 15  Glucose, capillary     Status: Abnormal   Collection Time: 12/05/14  5:52 AM  Result Value Ref Range   Glucose-Capillary 148 (H) 70 - 99 mg/dL  MRSA PCR Screening     Status: None   Collection Time: 12/05/14  6:49 AM  Result Value Ref Range   MRSA by PCR NEGATIVE NEGATIVE    Comment:        The GeneXpert MRSA Assay (FDA approved for NASAL specimens only), is one component of a comprehensive MRSA colonization surveillance program. It is not intended to diagnose MRSA infection nor to guide or monitor treatment for MRSA infections.   Glucose, capillary     Status: Abnormal   Collection Time: 12/05/14  7:43 AM  Result Value Ref Range    Glucose-Capillary 169 (H) 70 - 99 mg/dL    Ct Head  Wo Contrast  12/05/2014   CLINICAL DATA:  Altered mental status for several hours, with worsening lethargy and decreased level of consciousness. 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 Initial encounter.  EXAM: CT HEAD WITHOUT CONTRAST  TECHNIQUE: Contiguous axial images were obtained from the base of the skull through the vertex without intravenous contrast.  COMPARISON:  CT of the head performed 11/24/2014, and MRI of the brain performed 11/25/2014  FINDINGS: There is significantly worsened acute hemorrhage surrounding the patient's heterogeneous cerebellar mass, with surrounding decreased attenuation reflecting a combination of infiltration and vasogenic edema. The area of hemorrhage and mass now measures 4.5 x 3.3 cm, increased from 3.2 x 1.5 cm on prior MRI.  There is associated mass effect upon the cerebellum, with partial effacement of the fourth ventricle. No definite hydrocephalus is yet seen. The cisterns about the pons and cerebral peduncles appear grossly intact, though there is mild mass effect on the cerebellar peduncles.  Mild cortical volume loss is noted. Mild periventricular white matter change likely reflects small vessel ischemic microangiopathy. There is preservation of gray-white differentiation at the cerebral hemispheres.  There is no evidence of fracture; visualized osseous structures are unremarkable in appearance. The orbits are within normal limits. The paranasal sinuses and mastoid air cells are well-aerated. No significant soft tissue abnormalities are seen.  IMPRESSION: 1. Significantly worsened acute hemorrhage surrounding the patient's heterogeneous cerebellar mass, with associated vasogenic edema. The area of hemorrhage and mass now measures 4.5 x 3.3 cm, increased in size from 3.2 x 1.5 cm on the prior MRI (previously reflecting primarily the mass itself). 2. Mass effect noted on the cerebellum and cerebellar  peduncles, with partial effacement of the fourth ventricle. No definite hydrocephalus yet seen. 3. Mild cortical volume loss and scattered small vessel ischemic microangiopathy.  Critical Value/emergent results were called by telephone at the time of interpretation on 12/05/2014 at 3:42 am to Nursing on [place] 3W, who verbally acknowledged these results.   Electronically Signed   By: Garald Balding M.D.   On: 12/05/2014 03:47   Dg Abd 2 Views  12/04/2014   CLINICAL DATA:  Nausea, vomiting today  EXAM: ABDOMEN - 2 VIEW  COMPARISON:  CT abdomen 12/01/2014  FINDINGS: The bowel gas pattern is normal. There is no evidence of free air. No radio-opaque calculi or other significant radiographic abnormality is seen.  IMPRESSION: Negative.   Electronically Signed   By: Kathreen Devoid   On: 12/04/2014 13:46    Review of Systems  Unable to perform ROS: acuity of condition   Blood pressure 143/82, pulse 85, temperature 99.2 F (37.3 C), temperature source Oral, resp. rate 17, height 5' 2"  (1.575 m), weight 67.6 kg (149 lb 0.5 oz), SpO2 98 %. Physical Exam  Constitutional: She appears well-developed and well-nourished.  Neurological:  Obtunded individual. We will respond to pain and follow commands opening eyes area she states she just wants to go home. Pupils are 3 mm equally reactive days is fixed fold with bilateral nystagmoid movements. Moves all 4 extremities.    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  It is been decided to pursue a conservative course of action. Hospice and palliative care would be an appropriate consultation. I will write a no CPR order on the chart.  Serafina Topham J 12/05/2014, 10:33 AM

## 2014-12-05 NOTE — Progress Notes (Signed)
eLink Physician-Brief Progress Note Patient Name: Cathy Meadows DOB: 02/04/1946 MRN: [id number]   Date of Service  12/05/2014  HPI/Events of Note  63 F with h/o of lung CA and COPD hospitalized at [facility] with cerebellar mass.  Now with hemorrhage.  Transferred to [facility] for continued therapy.  Patient is HD stable with adequate respirations.  She is not acute distress.  eICU Interventions  Plan: No current [facility] intervention required. Consider addition of protonix for stress ulcer propy  Consider SCDs for DVT propy     Intervention Category Evaluation Type: New Patient Evaluation  DETERDING,ELIZABETH 12/05/2014, 6:53 AM

## 2014-12-05 NOTE — Progress Notes (Signed)
[facility] transferring pt at this time.

## 2014-12-05 NOTE — Progress Notes (Signed)
PT Cancellation Note  Patient Details Name: Cathy Meadows MRN: [id number] DOB: 1946/11/01   Cancelled Treatment:    Reason Eval/Treat Not Completed: Patient not medically ready.  Pt to OR today for Crani.  Will need new orders post-op when pt is appropriate for mobility.     Kahron Kauth, Thornton Papas 12/05/2014, 8:06 AM

## 2014-12-05 NOTE — Progress Notes (Signed)
[facility] hospitalist progress note. Chief complaint. Somnolence/lethargy. History of present illness. This is a 68 year old female in hospital with respiratory failure and hypoxemia related to COPD and lung cancer. Patient noted to have a left cerebellar hemorrhagic mass her recent brain MRI. This was reviewed by radiation oncology and neurosurgery and the tentative plan is to proceed with stereotactic radiosurgery for 12/05/14 and ultimately transferred to [facility] on 12/06/14 for resection of the brain tumor. The nurse noted the patient to be more difficult to arouse than baseline. Blood sugar was normal. I sent her for a CT scan of the brain and this has resulted showing significantly worsening hemorrhage surrounding the patient's heterogeneous cerebellar mass. Agenic edema. The area of hemorrhage and mass now measuring 4.5 x 3.3 cm increased in size from prior 3.2 x 1.5 cm prior MRI study. She had a severe headache earlier but time I saw her at bedside she indicated the headache was gone. Vital signs. Temperature 97.4, pulse 90, respiration 18, blood pressure 136/65. O2 sats 97%. General appearance. Frail elderly female who is somewhat somnolent but arousable with tactile stimulation. She is verbal and appears oriented. Cardiac. Rate and rhythm regular. Lungs. Breath sounds clear and equal. Abdomen. Soft with positive bowel sounds. Neurologic. Cranial nerves are grossly intact. No unilateral or focal effects. Patient does appear lethargic than baseline. Impression/plan. Problem #1. Hemorrhagic cerebellar mass. Scan findings are concerning given the rapid enlargement of hemorrhagic cerebellar mass. Discussed the case with Dr. Christella Noa neurosurgery an additional 10 mg of Decadron now and transfer the patient to [facility] neuro ICU. We'll arrange transfer as requested.

## 2014-12-05 NOTE — Progress Notes (Signed)
Patient ID: Cathy Meadows, female   DOB: [birth year], 68 y.o.   MRN: [id number] BP 139/80 mmHg  Pulse 78  Temp(Src) 97.9 F (36.6 C) (Oral)  Resp 19  Ht 5' 2"  (1.575 m)  Wt 67.6 kg (149 lb 0.5 oz)  BMI 27.25 kg/m2  SpO2 96% Called this am ~0430 by her physician to report decreased mental status. The physician could not tell me the time of the last exam before the change in her exam. The change was noted by her nurse whom had taken care of Cathy Meadows on both Saturday and Sunday. Head CT revealed the tumor which is a metastatic lung tumor to the left cerebellar hemisphere had bled again. She had been scheduled for preoperative radiation to the tumor to be followed by resection tomorrow.  Physical Exam  Constitutional: She is oriented to person, place, and time. She appears well-developed. She appears cachectic. She is cooperative. She has a sickly appearance. No distress.  Neurological: She is alert and oriented to person, place, and time. She has normal strength. A cranial nerve deficit is present. No sensory deficit. She exhibits normal muscle tone. GCS eye subscore is 4. GCS verbal subscore is 5. GCS motor subscore is 6. She displays no Babinski's sign on the right side. She displays no Babinski's sign on the left side.  Perrl, not able to discern nystagmus Dysarthric, fluent, fund of knowledge is normal Moving all extremities well, normal strength Dysmetric, +dysdiadokinesis, intention tremor all on left Tongue deviates towards the right on protrusion  Uvula midline Neither gait nor romberg assessed   Allergies  Allergen Reactions  . Codeine Rash  . Penicillins Rash  . Robaxin [Methocarbamol] Rash  . Sulfa Antibiotics Rash  . Tizanidine Rash   Prior to Admission medications   Medication Sig Start Date End Date Taking? Authorizing Provider  albuterol (PROVENTIL) (2.5 MG/3ML) 0.083% nebulizer solution Take 2.5 mg by nebulization every 6 (six) hours as needed for wheezing or  shortness of breath.   Yes Historical Provider, MD  amLODipine (NORVASC) 10 MG tablet Take 10 mg by mouth daily.   Yes Historical Provider, MD  ammonium lactate (LAC-HYDRIN) 12 % lotion Apply 1 application topically 2 (two) times daily. To the affected areas   Yes Historical Provider, MD  brimonidine (ALPHAGAN P) 0.1 % SOLN Place 1 drop into the right eye 2 (two) times daily.   Yes Historical Provider, MD  cholecalciferol (VITAMIN D) 1000 UNITS tablet Take 1,000 Units by mouth daily.   Yes Historical Provider, MD  cyclobenzaprine (FLEXERIL) 10 MG tablet Take 10 mg by mouth 3 (three) times daily as needed for muscle spasms.   Yes Historical Provider, MD  escitalopram (LEXAPRO) 10 MG tablet Take 10 mg by mouth 2 (two) times daily.   Yes Historical Provider, MD  fish oil-omega-3 fatty acids 1000 MG capsule Take 1 g by mouth 2 (two) times daily.   Yes Historical Provider, MD  furosemide (LASIX) 20 MG tablet Take 20 mg by mouth daily as needed for fluid or edema.   Yes Historical Provider, MD  lamoTRIgine (LAMICTAL) 150 MG tablet Take 150 mg by mouth 2 (two) times daily.   Yes Historical Provider, MD  latanoprost (XALATAN) 0.005 % ophthalmic solution Place 1 drop into the right eye at bedtime.   Yes Historical Provider, MD  LORazepam (ATIVAN) 1 MG tablet Take 1 mg by mouth 2 (two) times daily as needed for anxiety.   Yes Historical Provider, MD  metFORMIN (GLUCOPHAGE-XR) 500  MG 24 hr tablet Take 500 mg by mouth daily with breakfast.   Yes Historical Provider, MD  pioglitazone (ACTOS) 15 MG tablet Take 15 mg by mouth daily.   Yes Historical Provider, MD  pravastatin (PRAVACHOL) 80 MG tablet Take 80 mg by mouth at bedtime.   Yes Historical Provider, MD  valsartan (DIOVAN) 320 MG tablet Take 320 mg by mouth at bedtime.   Yes Historical Provider, MD  vitamin B-12 (CYANOCOBALAMIN) 1000 MCG tablet Take 1,000 mcg by mouth daily.   Yes Historical Provider, MD   Past Surgical History  Procedure Laterality Date   . Breast surgery right    . Parotidectomy    . Gallbladder surgery    . Abdominal hysterectomy      complete   Past Medical History  Diagnosis Date  . Diabetes mellitus without complication   . Hypertension   . Renal disorder   . Cancer   . Bipolar disorder   . Osteoarthritis   . Fibromyalgia   . Glaucoma     right eye  . Hyperlipidemia   . Asthma   . Alcohol abuse     quit 2007 per patient  . Tobacco abuse   . Right lower lobe lung mass     1st noted 09/2014   Family History  Problem Relation Age of Onset  . Hypertension Father   . Cancer Father     skin  . Stroke Father   . Heart disease Father   . Hypertension Mother   . Heart disease Mother   . Breast cancer Mother   . Cancer Mother     skin  . Stroke Mother    History   Social History  . Marital Status: Legally Separated    Spouse Name: N/A    Number of Children: N/A  . Years of Education: N/A   Occupational History  . Not on file.   Social History Main Topics  . Smoking status: Current Every Day Smoker -- 2.00 packs/day for 40 years    Types: Cigarettes  . Smokeless tobacco: Not on file  . Alcohol Use: No     Comment: Former heavy drinker, quit 2007  . Drug Use: No     Comment: polypharmacy  . Sexual Activity: Not on file   Other Topics Concern  . Not on file   Social History Narrative   Allergies  Allergen Reactions  . Codeine Rash  . Penicillins Rash  . Robaxin [Methocarbamol] Rash  . Sulfa Antibiotics Rash  . Tizanidine Rash   Dg Abd 2 Views  12/04/2014   CLINICAL DATA:  Nausea, vomiting today  EXAM: ABDOMEN - 2 VIEW  COMPARISON:  CT abdomen 12/01/2014  FINDINGS: The bowel gas pattern is normal. There is no evidence of free air. No radio-opaque calculi or other significant radiographic abnormality is seen.  IMPRESSION: Negative.   Electronically Signed   By: Kathreen Devoid   On: 12/04/2014 13:46   Results for orders placed or performed during the hospital encounter of 11/29/14  (from the past 24 hour(s))  Glucose, capillary     Status: Abnormal   Collection Time: 12/04/14 11:42 AM  Result Value Ref Range   Glucose-Capillary 168 (H) 70 - 99 mg/dL  Glucose, capillary     Status: Abnormal   Collection Time: 12/04/14  5:05 PM  Result Value Ref Range   Glucose-Capillary 193 (H) 70 - 99 mg/dL  Glucose, capillary     Status: Abnormal   Collection Time:  12/04/14  9:36 PM  Result Value Ref Range   Glucose-Capillary 139 (H) 70 - 99 mg/dL   Comment 1 Notify RN   Glucose, capillary     Status: Abnormal   Collection Time: 12/05/14 12:25 AM  Result Value Ref Range   Glucose-Capillary 147 (H) 70 - 99 mg/dL  CBC     Status: Abnormal   Collection Time: 12/05/14  4:01 AM  Result Value Ref Range   WBC 12.1 (H) 4.0 - 10.5 K/uL   RBC 4.77 3.87 - 5.11 MIL/uL   Hemoglobin 12.6 12.0 - 15.0 g/dL   HCT 40.5 36.0 - 46.0 %   MCV 84.9 78.0 - 100.0 fL   MCH 26.4 26.0 - 34.0 pg   MCHC 31.1 30.0 - 36.0 g/dL   RDW 17.8 (H) 11.5 - 15.5 %   Platelets 385 150 - 400 K/uL  Basic metabolic panel     Status: Abnormal   Collection Time: 12/05/14  4:01 AM  Result Value Ref Range   Sodium 138 137 - 147 mEq/L   Potassium 4.4 3.7 - 5.3 mEq/L   Chloride 98 96 - 112 mEq/L   CO2 28 19 - 32 mEq/L   Glucose, Bld 186 (H) 70 - 99 mg/dL   BUN 28 (H) 6 - 23 mg/dL   Creatinine, Ser 0.61 0.50 - 1.10 mg/dL   Calcium 11.2 (H) 8.4 - 10.5 mg/dL   GFR calc non Af Amer >90 >90 mL/min   GFR calc Af Amer >90 >90 mL/min   Anion gap 12 5 - 15  Glucose, capillary     Status: Abnormal   Collection Time: 12/05/14  5:52 AM  Result Value Ref Range   Glucose-Capillary 148 (H) 70 - 99 mg/dL   A/P Cathy Meadows has known lung met to brain. Secondary to hemorrhage surgery will be performed today, and there will be no preop stereotactic radiation given recent change. Dr. Ellene Route will see patient before procedure.

## 2014-12-05 NOTE — Progress Notes (Signed)
Orders in for transfer to Neuro ICU at [facility] under Dr. Christella Noa. Reports called to [facility] and to nurse in N[facility] at [facility]. VS remain stable, neuro assessment with some changes, in particular rt pupil now irregular and sluggish. Pt favors leaning to left side in bed. Attempted to contact pts son, left him a voicemail to call RN asap for information. Hortencia Conradi RN

## 2014-12-05 NOTE — Progress Notes (Signed)
Pt has seemed more lethargic from the start of this shift as compared to yesterday. See previous note regarding pt's c/o headache. VSS with BP 135/65, HR 90, RR 18, O2 97% on 2L. Neuro assessment unremarkable aside from decreased LOC. Pupils equal, brisk; grips equal. Pt with delayed 1-2 word responses and takes a lot of stimulation to get her to open her eyes and talk. Pt with increased dizziness, nausea, and vomiting over last 2 days. Provider paged regarding issues and called rapid response to assess pt, progress note to come. Order for stat CT head, to be performed shortly. Will continue to monitor. Hortencia Conradi RN

## 2014-12-05 NOTE — Consult Note (Signed)
Patient WU:[id number] Cathy Meadows      DOB: 01/26/1946      JYN:[id number]     Consult Note from the Palliative Medicine Team at [place] Requested by: Dr Christella Noa    PCP: Ernestene Kiel, MD Reason for Consultation: Clarification of GOPC and options     Phone [phone]  Assessment of patients Current state:   Complicated medical history since lung mass diagnosis approximately one month ago.  Rapid decline (physical, functional and cognitive)  over the past 24 hours 2/2 CT finding of hemorrhage within known cerebellar tumor.   Patient and family have made decision for no further diagnostics or treatments and to allow a natural course.  They are hopeful for a hospice facility in [facility].  Recommend continued care on 54M unit through the night and transfer directly to hospice facility in morning.  Patient will need aggressive symptom management.  On a full comfort path her prognosis is likely days to weeks.  Consult is for review of medical treatment options, clarification of goals of care and end of life issues, disposition and options, and symptom recommendation.  This NP Wadie Lessen reviewed medical records, received report from team, assessed the patient and then meet at the patient's bedside along with [name] # [phone]  to discuss diagnosis prognosis, GOC, EOL wishes disposition and options.  A detailed discussion was had today regarding advanced directives.  Concepts specific to code status, artifical feeding and hydration, continued IV antibiotics and rehospitalization was had.  The difference between a aggressive medical intervention path  and a palliative comfort care path for this patient at this time was had.  Values and goals of care important to patient and family were attempted to be elicited.  Concept of Hospice and Palliative Care were discussed  Natural trajectory and expectations at EOL were discussed.  Questions and concerns  addressed. Family encouraged to call with questions or concerns.  PMT will continue to support holistically.   Goals of Care: 1.  Code Status: DNR/DNI-comfort is main focus of care   2. Scope of Treatment: Shift to full comfort approach.  No further diagnostics, artificial feeding or hydration, IV antibiotics   3. Disposition:  Hopeful for hospice facility of [place]   4. Symptom Management:   1. Anxiety/Agitation: Ativan 1 mg IV every 4 hrs prn 2. Pain/Dyspnea: Morphine 1 mg IV every 1 hr prn 3. Bowel Regimen:  Dulcolax supp daily prn 4. Nausea/Vomiting: Continue Decadron 4 mg IV every 4 hrs                                                        Zofran 4 mg IV every 4 hrs prn   5. Psychosocial:  Emotional support offered to son/HPOA at bedsdie.  He speaks to his special relationship with his mother, happy memories and comfort with the decision for comfort honoring her wsihes.   6. Spiritual:  Chaplain involved   Brief HPI:  68 year old female  who was diagnosed with a lung mass about a month ago. She had developed significant headache and instability on her feet. 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 It was planned that the patient should undergo biopsy of the lung mass for diagnosis and appropriate  treatment either radiation or surgery for the metastasis to the brain. It was planned that the patient was to have preoperative stereotactic radiosurgery for the cerebellar mass as the diagnosis was squamous cell carcinoma, followed by surgical resection scheduled for tomorrow.  However early this morning the patient had deterioration in her level of function at [facility] and a CT scan demonstrated the presence of hemorrhage within the cerebellar tumor. She was transferred to [facility] emergently. Her son is present currently and after significant discussions of her findings on the CT scan  he noted that her desire was that if something significant happened that she would not want to pursue further aggressive treatment. He feels that she is lucid in her desire to simply go home that she would not want aggressive care in light of the significant change that has occurred  Patient and family are hopeful for hospice facility  ROS:  Unable to illicit due to decreased cognition   PMH:  Past Medical History  Diagnosis Date  . Diabetes mellitus without complication   . Hypertension   . Renal disorder   . Cancer   . Bipolar disorder   . Osteoarthritis   . Fibromyalgia   . Glaucoma     right eye  . Hyperlipidemia   . Asthma   . Alcohol abuse     quit 2007 per patient  . Tobacco abuse   . Right lower lobe lung mass     1st noted 09/2014     PSH: Past Surgical History  Procedure Laterality Date  . Breast surgery right    . Parotidectomy    . Gallbladder surgery    . Abdominal hysterectomy      complete   I have reviewed the [place] and SH and  If appropriate update it with new information. Allergies  Allergen Reactions  . Codeine Rash  . Penicillins Rash  . Robaxin [Methocarbamol] Rash  . Sulfa Antibiotics Rash  . Tizanidine Rash   Scheduled Meds: . amLODipine  10 mg Oral Daily  . antiseptic oral rinse  7 mL Mouth Rinse BID  . dexamethasone  4 mg Intravenous 4 times per day  . escitalopram  10 mg Oral BID  . insulin aspart  0-9 Units Subcutaneous TID WC  . irbesartan  75 mg Oral Daily  . lamoTRIgine  150 mg Oral BID  . latanoprost  1 drop Right Eye QHS  . ondansetron  8 mg Oral 3 times per day  . polyethylene glycol  17 g Oral BID  . senna-docusate  1 tablet Oral BID  . sodium chloride  3 mL Intravenous Q12H  . white petrolatum       Continuous Infusions:  PRN Meds:.sodium chloride, albuterol, cyclobenzaprine, LORazepam, morphine injection, naLOXone (NARCAN)  injection, ondansetron **OR** ondansetron (ZOFRAN) IV, oxyCODONE, promethazine, sodium  chloride    BP 140/73 mmHg  Pulse 77  Temp(Src) 97.2 F (36.2 C) (Oral)  Resp 19  Ht 5\' 2"  (1.575 m)  Wt 67.6 kg (149 lb 0.5 oz)  BMI 27.25 kg/m2  SpO2 93%   PPS:20 % at best   Intake/Output Summary (Last 24 hours) at 12/05/14 1339 Last data filed at 12/05/14 1200  Gross per 24 hour  Intake  272.5 ml  Output   1460 ml  Net -1187.5 ml    Physical Exam:  General: ill appearing, NAD HEENT:  Dry buccal membranes  Chest:  Decreased in bases CVS: RRR Neuro: Open eyes to name, garbled  speech, follows simple commands  Labs: CBC    Component Value Date/Time   WBC 12.1* 12/05/2014 0401   WBC 14.0* 12/25/2007 1534   RBC 4.77 12/05/2014 0401   RBC 5.03 12/25/2007 1534   HGB 12.6 12/05/2014 0401   HGB 15.4 12/25/2007 1534   HCT 40.5 12/05/2014 0401   HCT 45.1 12/25/2007 1534   PLT 385 12/05/2014 0401   PLT 345 12/25/2007 1534   MCV 84.9 12/05/2014 0401   MCV 89.6 12/25/2007 1534   MCH 26.4 12/05/2014 0401   MCH 30.7 12/25/2007 1534   MCHC 31.1 12/05/2014 0401   MCHC 34.2 12/25/2007 1534   RDW 17.8* 12/05/2014 0401   RDW 14.5 12/25/2007 1534   LYMPHSABS 3.0 07/08/2013 1521   LYMPHSABS 2.3 12/25/2007 1534   MONOABS 0.8 07/08/2013 1521   MONOABS 0.6 12/25/2007 1534   EOSABS 0.2 07/08/2013 1521   EOSABS 0.1 12/25/2007 1534   BASOSABS 0.1 07/08/2013 1521   BASOSABS 0.2* 12/25/2007 1534    BMET    Component Value Date/Time   NA 138 12/05/2014 0401   K 4.4 12/05/2014 0401   CL 98 12/05/2014 0401   CO2 28 12/05/2014 0401   GLUCOSE 186* 12/05/2014 0401   BUN 28* 12/05/2014 0401   CREATININE 0.61 12/05/2014 0401   CALCIUM 11.2* 12/05/2014 0401   GFRNONAA >90 12/05/2014 0401   GFRAA >90 12/05/2014 0401    CMP     Component Value Date/Time   NA 138 12/05/2014 0401   K 4.4 12/05/2014 0401   CL 98 12/05/2014 0401   CO2 28 12/05/2014 0401   GLUCOSE 186* 12/05/2014 0401   BUN 28* 12/05/2014 0401   CREATININE 0.61 12/05/2014 0401   CALCIUM 11.2* 12/05/2014  0401   PROT 6.4 12/02/2014 0520   ALBUMIN 2.5* 12/02/2014 0520   AST 13 12/02/2014 0520   ALT 14 12/02/2014 0520   ALKPHOS 102 12/02/2014 0520   BILITOT <0.2* 12/02/2014 0520   GFRNONAA >90 12/05/2014 0401   GFRAA >90 12/05/2014 0401       Time In Time Out Total Time Spent with Patient Total Overall Time  1300 1415 75 min 80 min    Greater than 50%  of this time was spent counseling and coordinating care related to the above assessment and plan.   Wadie Lessen NP  Palliative Medicine Team Team Phone # [phone] Pager [phone]  Discussed OR nurse to inofrm Dr Ellene Route

## 2014-12-05 NOTE — Progress Notes (Signed)
   12/05/14 1100  Clinical Encounter Type  Visited With Patient and family together  Visit Type Initial;Spiritual support;Social support;Critical Care  Referral From Nurse  Stress Factors  Family Stress Factors Health changes;Major life changes   Chaplain was paged to patient's room at 10:45 AM. Chaplain introduced himself to patient's son. Patient's son explained that the condition of his mother is worsening and that she was transferred from [place] to [facility] this morning. Patient's son has never been through a situation like this before. Patient's son had been supporting the patient for an entire week at bedside before the patient's other son relieved him. Patient's son that is present currently is from [place], [place] and packed a bag and traveled to the hospital today to be with the patient. Patient's son was emotional but knows that his mother's wishes are being honored and that she would not want to suffer. Patient's son asked for prayer and chaplain prayed with the patient and the patient's son. Chaplain will continue to provide emotional and spiritual support for patient and patient's family as needed. Normon Pettijohn, Claudius Sis, Chaplain  11:14 AM

## 2014-12-06 ENCOUNTER — Ambulatory Visit: Payer: Medicare Other | Admitting: Radiation Oncology

## 2014-12-06 NOTE — Progress Notes (Signed)
  Radiation Oncology         [phone] ________________________________  Name: Cathy Meadows MRN: [id number]  Date: 11/29/2014  DOB: Jul 05, 1946  Chart Note:  Recent events noted. After planning for possible preoperative stereotactic radiosurgery for this patient's solitary brain metastasis, she developed further intracranial hemorrhage warranting transfer to the neurological ICU and consultation with neurosurgery. I was in contact with Dr. Ellene Route from neurosurgery at various points along her progress. I understand the patient and her son have declined neurosurgical intervention and have elected to proceed with hospice care. Given the complexity of her medical issues, that was a very reasonable option for her.  ________________________________  Sheral Apley. Tammi Klippel, M.D.

## 2014-12-06 NOTE — Clinical Social Work Note (Signed)
Clinical Social Worker facilitated patient discharge including contacting patient family and facility to confirm patient discharge plans.  Clinical information faxed to facility and family agreeable with plan.  CSW arranged ambulance transport via [facility] to [facility].  RN to call report prior to discharge.  Clinical Social Worker will sign off for now as social work intervention is no longer needed. Please consult [place] again if new need arises.  Barbette Or, [place]

## 2014-12-12 ENCOUNTER — Ambulatory Visit (HOSPITAL_COMMUNITY): Admission: RE | Admit: 2014-12-12 | Payer: Medicare Other | Source: Ambulatory Visit

## 2014-12-12 ENCOUNTER — Ambulatory Visit (HOSPITAL_COMMUNITY): Payer: Medicare Other | Attending: Internal Medicine

## 2014-12-12 ENCOUNTER — Encounter (HOSPITAL_COMMUNITY): Payer: PRIVATE HEALTH INSURANCE

## 2014-12-13 ENCOUNTER — Encounter: Payer: Self-pay | Admitting: *Deleted

## 2014-12-16 DEATH — deceased

## 2015-01-03 ENCOUNTER — Ambulatory Visit: Payer: PRIVATE HEALTH INSURANCE | Admitting: Critical Care Medicine
